# Patient Record
Sex: Female | Born: 1960
Health system: Southern US, Community
[De-identification: ages and names within clinical notes are randomized; demographics above are authoritative.]

## PROBLEM LIST (undated history)

## (undated) DIAGNOSIS — E785 Hyperlipidemia, unspecified: Secondary | ICD-10-CM

## (undated) DIAGNOSIS — F419 Anxiety disorder, unspecified: Secondary | ICD-10-CM

## (undated) DIAGNOSIS — M1990 Unspecified osteoarthritis, unspecified site: Secondary | ICD-10-CM

## (undated) DIAGNOSIS — T7840XA Allergy, unspecified, initial encounter: Secondary | ICD-10-CM

## (undated) DIAGNOSIS — K219 Gastro-esophageal reflux disease without esophagitis: Secondary | ICD-10-CM

## (undated) DIAGNOSIS — E559 Vitamin D deficiency, unspecified: Secondary | ICD-10-CM

## (undated) HISTORY — PX: TUBAL LIGATION: SHX77

## (undated) HISTORY — PX: COLONOSCOPY W/ POLYPECTOMY: SHX1380

## (undated) HISTORY — DX: Unspecified osteoarthritis, unspecified site: M19.90

## (undated) HISTORY — DX: Hyperlipidemia, unspecified: E78.5

## (undated) HISTORY — PX: HERNIA REPAIR: SHX51

## (undated) HISTORY — PX: COLONOSCOPY: SHX174

## (undated) HISTORY — DX: Vitamin D deficiency, unspecified: E55.9

## (undated) HISTORY — DX: Allergy, unspecified, initial encounter: T78.40XA

## (undated) HISTORY — DX: Gastro-esophageal reflux disease without esophagitis: K21.9

---

## 2018-02-01 ENCOUNTER — Ambulatory Visit (INDEPENDENT_AMBULATORY_CARE_PROVIDER_SITE_OTHER): Payer: 59 | Admitting: Family Medicine

## 2018-02-01 ENCOUNTER — Encounter (INDEPENDENT_AMBULATORY_CARE_PROVIDER_SITE_OTHER): Payer: Self-pay

## 2018-02-01 ENCOUNTER — Telehealth: Payer: Self-pay | Admitting: Family Medicine

## 2018-02-01 ENCOUNTER — Encounter: Payer: Self-pay | Admitting: Family Medicine

## 2018-02-01 VITALS — BP 102/70 | HR 88 | Temp 98.0°F | Resp 16 | Ht 62.0 in | Wt 129.0 lb

## 2018-02-01 DIAGNOSIS — E559 Vitamin D deficiency, unspecified: Secondary | ICD-10-CM | POA: Diagnosis not present

## 2018-02-01 DIAGNOSIS — Z1239 Encounter for other screening for malignant neoplasm of breast: Secondary | ICD-10-CM

## 2018-02-01 DIAGNOSIS — Z1231 Encounter for screening mammogram for malignant neoplasm of breast: Secondary | ICD-10-CM | POA: Diagnosis not present

## 2018-02-01 DIAGNOSIS — Z791 Long term (current) use of non-steroidal anti-inflammatories (NSAID): Secondary | ICD-10-CM

## 2018-02-01 DIAGNOSIS — M19049 Primary osteoarthritis, unspecified hand: Secondary | ICD-10-CM | POA: Diagnosis not present

## 2018-02-01 DIAGNOSIS — Z131 Encounter for screening for diabetes mellitus: Secondary | ICD-10-CM

## 2018-02-01 DIAGNOSIS — E785 Hyperlipidemia, unspecified: Secondary | ICD-10-CM | POA: Diagnosis not present

## 2018-02-01 DIAGNOSIS — Z7689 Persons encountering health services in other specified circumstances: Secondary | ICD-10-CM | POA: Diagnosis not present

## 2018-02-01 HISTORY — DX: Primary osteoarthritis, unspecified hand: M19.049

## 2018-02-01 NOTE — Telephone Encounter (Addendum)
Please request previous records:   Previous PCP:  Dr. Lorretta [place] [address]  Previous GYN: Dr. Pauline Good [address] [place] [address] [phone]  Mammograms:  [facility] [address] [place] [address]  [phone]

## 2018-02-01 NOTE — Progress Notes (Signed)
Name: Jennifer Lang   MRN: [id number]    DOB: 1961/11/25   Date:02/01/2018       Progress Note  Subjective  Chief Complaint  Chief Complaint  Patient presents with  . Establish Care  . Hyperlipidemia    refused medication from previos doctor    HPI  Pt presents to establish care - she moved from [place] - we will request previous records.  She is now working for [employer] in [employer].  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  Hyperlipidemia: She has been taking Red yeast rice extract on and off for about a year.  She eats mostly lean proteins, but does like some junk food and ice cream.  Mother and sister have hyperlipidemia.  We will check fasting labs today.  Health Maintenance: She reports colonoscopy 3 years ago, she thinks she is not due for 10 years, but we will request records and check on this.  Last mammogram - last year, she is due for this today.  Vitamin D Deficiency: Takes Vitamin D OTC supplement for history of deficiency - she notes that if she doesn't take it, she will get some tingling in the LEFT arm.  Allergic Rhinitis: Never had allergies until she moved to [place]. She takes Zyrtec as needed for rhinitis and headache.  Denies symptoms since being in [place].  Arthritis: Has never had formal work up; affects mostly her hands.  She takes Noni (natural supplement) from [place] that helps with her pain and inflammation. Does not get erythema or inflammation. Does not like to take ibuprofen; never takes Tylenol.  There are no active problems to display for this patient.  Past Surgical History:  Procedure Laterality Date  . CESAREAN SECTION    . TUBAL LIGATION      Family History  Problem Relation Age of Onset  . Arthritis Mother   . Cancer Mother   . Alzheimer's disease Mother   . Hyperlipidemia Mother   . Hypertension Father   . Breast cancer Paternal Aunt    Passed away in 59's from Breast CA  . Hyperlipidemia Sister   . Bladder Cancer Brother   . Prostate cancer Brother   . Kidney cancer Brother   . Stroke Maternal Grandmother   . Uterine cancer Paternal Grandmother     Social History   Socioeconomic History  . Marital status: Single    Spouse name: Not on file  . Number of children: Not on file  . Years of education: Not on file  . Highest education level: Not on file  Social Needs  . Financial resource strain: Not on file  . Food insecurity - worry: Not on file  . Food insecurity - inability: Not on file  . Transportation needs - medical: Not on file  . Transportation needs - non-medical: Not on file  Occupational History  . Not on file  Tobacco Use  . Smoking status: Never Smoker  . Smokeless tobacco: Never Used  Substance and Sexual Activity  . Alcohol use: No    Frequency: Never  . Drug use: No  . Sexual activity: Not on file  Other Topics Concern  . Not on file  Social History Narrative  . Not on file     Current Outpatient Medications:  .  cetirizine (ZYRTEC) 10 MG tablet, Take 10 mg by mouth daily., Disp: , Rfl:  .  cholecalciferol (  VITAMIN D) 400 units TABS tablet, Take 400 Units by mouth., Disp: , Rfl:  .  Multiple Vitamin (MULTIVITAMIN) tablet, Take 1 tablet by mouth daily., Disp: , Rfl:  .  Red Yeast Rice Extract (RED YEAST RICE PO), Take by mouth., Disp: , Rfl:   No Known Allergies  ROS Constitutional: Negative for fever or weight change.  Respiratory: Negative for cough and shortness of breath.   Cardiovascular: Negative for chest pain or palpitations.  Gastrointestinal: Negative for abdominal pain, no bowel changes.  Musculoskeletal: Negative for gait problem or joint swelling.  Skin: Negative for rash.  Neurological: Negative for dizziness or headache.  No other specific complaints in a complete review of systems (except as listed in HPI above).  Objective  Vitals:   02/01/18 0847  BP: 102/70   Pulse: 88  Resp: 16  Temp: 98 F (36.7 C)  TempSrc: Oral  SpO2: 96%  Weight: 129 lb (58.5 kg)  Height: 5\' 2"  (1.575 m)   Body mass index is 23.59 kg/m.  Physical Exam Constitutional: Patient appears well-developed and well-nourished. No distress.  HENT: Head: Normocephalic and atraumatic. Ears: B TMs ok, no erythema or effusion; Nose: Nose normal. Mouth/Throat: Oropharynx is clear and moist. No oropharyngeal exudate.  Eyes: Conjunctivae and EOM are normal. Pupils are equal, round, and reactive to light. No scleral icterus.  Neck: Normal range of motion. Neck supple. No JVD present.  Cardiovascular: Normal rate, regular rhythm and normal heart sounds.  No murmur heard. No BLE edema. Pulmonary/Chest: Effort normal and breath sounds normal. No respiratory distress. Musculoskeletal: Normal range of motion, no joint effusions. No gross deformities, joint erythema, or tenderness. Neurological: she is alert and oriented to person, place, and time. No cranial nerve deficit. Coordination, balance, strength, speech and gait are normal.  Skin: Skin is warm and dry. No rash noted. No erythema.  Psychiatric: Patient has a normal mood and affect. behavior is normal. Judgment and thought content normal.  No results found for this or any previous visit (from the past 72 hour(s)).  PHQ2/9: Depression screen PHQ 2/9 02/01/2018  Decreased Interest 0  Down, Depressed, Hopeless 0  PHQ - 2 Score 0    Fall Risk: Fall Risk  02/01/2018  Falls in the past year? No   Assessment & Plan  1. Hyperlipidemia, unspecified hyperlipidemia type - May continue red yeast rice for the time being.  - Discussed importance of 150 minutes of physical activity weekly, eat two servings of fish weekly, eat one serving of tree nuts ( cashews, pistachios, pecans, almonds.Marland Kitchen) every other day, eat 6 servings of fruit/vegetables daily and drink plenty of water and avoid sweet beverages.  - Lipid panel  2. Breast cancer  screening - MM DIGITAL SCREENING BILATERAL; Future  3. Vitamin D deficiency - VITAMIN D 25 Hydroxy (Vit-D Deficiency, Fractures)  4. Arthritis of hand - May continue OTC supplement and PRN ibuprofen. Avoid activities that trigger pain.  5. Diabetes mellitus screening - COMPLETE METABOLIC PANEL WITH GFR  6. NSAID long-term use - COMPLETE METABOLIC PANEL WITH GFR  7. Encounter to establish care - Records to be requested - see telephone note from 02/01/2018.   - Return for S/C CPE.

## 2018-02-10 ENCOUNTER — Encounter: Payer: Self-pay | Admitting: Family Medicine

## 2018-02-10 NOTE — Telephone Encounter (Signed)
Patient only sign 1 form for mammogram, which we received. Must sign on for other 2 offices at next appointment

## 2018-02-17 ENCOUNTER — Encounter: Payer: Self-pay | Admitting: Radiology

## 2018-02-17 ENCOUNTER — Ambulatory Visit
Admission: RE | Admit: 2018-02-17 | Discharge: 2018-02-17 | Disposition: A | Discharge status: Home / Self Care | Payer: 59 | Source: Ambulatory Visit | Attending: Family Medicine | Admitting: Family Medicine

## 2018-02-17 DIAGNOSIS — Z1231 Encounter for screening mammogram for malignant neoplasm of breast: Secondary | ICD-10-CM | POA: Diagnosis not present

## 2018-02-17 DIAGNOSIS — Z1239 Encounter for other screening for malignant neoplasm of breast: Secondary | ICD-10-CM

## 2018-03-01 ENCOUNTER — Encounter: Payer: Self-pay | Admitting: Family Medicine

## 2018-03-01 ENCOUNTER — Ambulatory Visit (INDEPENDENT_AMBULATORY_CARE_PROVIDER_SITE_OTHER): Payer: 59 | Admitting: Family Medicine

## 2018-03-01 VITALS — BP 110/70 | HR 83 | Temp 98.1°F | Resp 16 | Ht 62.0 in | Wt 129.3 lb

## 2018-03-01 DIAGNOSIS — E785 Hyperlipidemia, unspecified: Secondary | ICD-10-CM | POA: Diagnosis not present

## 2018-03-01 DIAGNOSIS — Z0000 Encounter for general adult medical examination without abnormal findings: Secondary | ICD-10-CM | POA: Diagnosis not present

## 2018-03-01 DIAGNOSIS — Z1159 Encounter for screening for other viral diseases: Secondary | ICD-10-CM | POA: Diagnosis not present

## 2018-03-01 DIAGNOSIS — Z114 Encounter for screening for human immunodeficiency virus [HIV]: Secondary | ICD-10-CM | POA: Diagnosis not present

## 2018-03-01 DIAGNOSIS — E559 Vitamin D deficiency, unspecified: Secondary | ICD-10-CM | POA: Diagnosis not present

## 2018-03-01 DIAGNOSIS — Z791 Long term (current) use of non-steroidal anti-inflammatories (NSAID): Secondary | ICD-10-CM | POA: Diagnosis not present

## 2018-03-01 NOTE — Progress Notes (Signed)
Name: Jennifer Lang   MRN: [id number]    DOB: [birth year]   Date:03/01/2018       Progress Note  Subjective  Chief Complaint  Chief Complaint  Patient presents with  . Annual Exam    HPI  Patient presents for annual CPE.  Diet: Cereal or toast; eats at work Clinical research associate) - chicken, veggies, and dessert; dinner (gets home at 8pm) will eat light like soup or veggies.  She does have pretzels, etc on days off. 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  USPSTF grade A and B recommendations  Depression: Feeling well, never had issues with depression in the past.  Depression screen [facility] 2/9 03/01/2018 02/01/2018  Decreased Interest 0 0  Down, Depressed, Hopeless 0 0  PHQ - 2 Score 0 0   Hypertension: BP Readings from Last 3 Encounters:  03/01/18 110/70  02/01/18 102/70   Obesity: Wt Readings from Last 3 Encounters:  03/01/18 129 lb 4.8 oz (58.7 kg)  02/01/18 129 lb (58.5 kg)   BMI Readings from Last 3 Encounters:  03/01/18 23.65 kg/m  02/01/18 23.59 kg/m    Alcohol: Never drinker Tobacco use: Never smoker HIV, hep B, hep C: Declines Hep B; will check HIV and Hep C today STD testing and prevention (chl/gon/syphilis): Declines Intimate partner violence: No concerns today Sexual History/Pain during Intercourse: Not currently sexually active - last intercourse was approx 2 years ago. Menstrual History/LMP/Abnormal Bleeding: Postmenopausal x5 years,  Incontinence Symptoms: Occasional urge incontinence   Breast cancer: Mammogram is UTD No results found for: HMMAMMO  BRCA gene screening: Mother and paternal aunt had breast cancer Cervical cancer screening: Pt states had Pap last year and was told not due for 2 years - we will request records today to review.   Osteoporosis: Had Dexa Scan once many years ago and she says it was WNL. We will request records today to review. No results found for: HMDEXASCAN   Fall prevention/vitamin D: Takes OTC supplement of vitamin D Lipids: Will check labs today No results found for: CHOL No results found for: HDL No results found for: LDLCALC No results found for: TRIG No results found for: CHOLHDL No results found for: LDLDIRECT  Glucose: We will check labs today No results found for: GLUCOSE, GLUCAP  Skin cancer: No concerning moles or lesions Colorectal cancer: We will request records today; she notes colonoscopy 3 years ago - she is unsure of recommended frequency. Aspirin: Currently takes some days. ECG: We will request records today; Used to have palpitations in the past - many years ago. Denies chest pain or shortness of breath.  Patient Active Problem List   Diagnosis Date Noted  . Arthritis of hand 02/01/2018  . Vitamin D deficiency 02/01/2018  . Hyperlipidemia 02/01/2018  . Breast cancer screening 02/01/2018    Past Surgical History:  Procedure Laterality Date  . CESAREAN SECTION    . TUBAL LIGATION      Family History  Problem Relation Age of Onset  . Arthritis Mother   . Cancer Mother   . Alzheimer's disease Mother   . Hyperlipidemia Mother   . Breast cancer Mother   . Hypertension Father   . Breast cancer Paternal Aunt        Passed away in 67's from Breast CA  . Hyperlipidemia Sister   . Bladder Cancer Brother   . Prostate cancer Brother   . Kidney cancer Brother   .  Stroke Maternal Grandmother   . Uterine cancer Paternal Grandmother     Social History   Socioeconomic History  . Marital status: Divorced    Spouse name: Not on file  . Number of children: 3  . Years of education: Not on file  . Highest education level: Associate degree: academic program  Social Needs  . Financial resource strain: Not on file  . Food insecurity - worry: Never true  . Food insecurity - inability: Never true  . Transportation needs - medical: No  . Transportation needs - non-medical: No  Occupational History  . Not on file   Tobacco Use  . Smoking status: Never Smoker  . Smokeless tobacco: Never Used  Substance and Sexual Activity  . Alcohol use: No    Frequency: Never  . Drug use: No  . Sexual activity: Not on file  Other Topics Concern  . Not on file  Social History Narrative   Lives with daughter and granddaughter.  Moved from [place] in August 2018. Works at [employer] at [employer] in [employer].     Current Outpatient Medications:  .  cetirizine (ZYRTEC) 10 MG tablet, Take 10 mg by mouth daily., Disp: , Rfl:  .  cholecalciferol (VITAMIN D) 400 units TABS tablet, Take 400 Units by mouth., Disp: , Rfl:  .  Multiple Vitamin (MULTIVITAMIN) tablet, Take 1 tablet by mouth daily., Disp: , Rfl:  .  Red Yeast Rice Extract (RED YEAST RICE PO), Take by mouth., Disp: , Rfl:   No Known Allergies   ROS  Constitutional: Negative for fever or weight change.  Respiratory: Negative for cough and shortness of breath.   Cardiovascular: Negative for chest pain or palpitations.  Gastrointestinal: Negative for abdominal pain, no bowel changes.  Musculoskeletal: Negative for gait problem or joint swelling.  Skin: Negative for rash.  Neurological: Negative for dizziness or headache.  No other specific complaints in a complete review of systems (except as listed in HPI above).  Objective  Vitals:   03/01/18 0820  BP: 110/70  Pulse: 83  Resp: 16  Temp: 98.1 F (36.7 C)  TempSrc: Oral  SpO2: 98%  Weight: 129 lb 4.8 oz (58.7 kg)  Height: 5' 2" (1.575 m)    Body mass index is 23.65 kg/m.  Physical Exam Constitutional: Patient appears well-developed and well-nourished. No distress.  HENT: Head: Normocephalic and atraumatic. Ears: B TMs ok, no erythema or effusion; Nose: Nose normal. Mouth/Throat: Oropharynx is clear and moist. No oropharyngeal exudate.  Eyes: Conjunctivae and EOM are normal. Pupils are equal, round, and reactive to light. No scleral icterus.  Neck: Normal range of motion. Neck supple.  No JVD present. No thyromegaly present.  Cardiovascular: Normal rate, regular rhythm and normal heart sounds.  No murmur heard. No BLE edema. Pulmonary/Chest: Effort normal and breath sounds normal. No respiratory distress. Abdominal: Soft. Bowel sounds are normal, no distension. There is no tenderness. no masses Musculoskeletal: Normal range of motion, no joint effusions. No gross deformities Neurological: he is alert and oriented to person, place, and time. No cranial nerve deficit. Coordination, balance, strength, speech and gait are normal.  Skin: Skin is warm and dry. No rash noted. No erythema.  Psychiatric: Patient has a normal mood and affect. behavior is normal. Judgment and thought content normal.   No results found for this or any previous visit (from the past 2160 hour(s)). ] PHQ2/9: Depression screen [facility] 2/9 03/01/2018 02/01/2018  Decreased Interest 0 0  Down, Depressed, Hopeless 0 0  PHQ - 2 Score 0 0    Fall Risk: Fall Risk  03/01/2018 02/01/2018  Falls in the past year? No No    Functional Status Survey: Is the patient deaf or have difficulty hearing?: No Does the patient have difficulty seeing, even when wearing glasses/contacts?: No Does the patient have difficulty concentrating, remembering, or making decisions?: No Does the patient have difficulty walking or climbing stairs?: No Does the patient have difficulty dressing or bathing?: No Does the patient have difficulty doing errands alone such as visiting a doctor's office or shopping?: No   Assessment & Plan  1. Well woman exam (no gynecological exam) - We will request records today (pt needed to sign release to allow for request).  If Pap is needed, she will return for this.  We will refer for colonoscopy if warranted.  She declines TDAP - states was done just prior to starting with [facility] this year - she will try to obtain records for [place]. -USPSTF grade A and B recommendations reviewed with patient;  age-appropriate recommendations, preventive care, screening tests, etc discussed and encouraged; healthy living encouraged; see AVS for patient education given to patient -Discussed importance of 150 minutes of physical activity weekly, eat two servings of fish weekly, eat one serving of tree nuts ( cashews, pistachios, pecans, almonds.Marland Kitchen) every other day, eat 6 servings of fruit/vegetables daily and drink plenty of water and avoid sweet beverages.  - Fasting labs as ordered at last visit.  2. Need for hepatitis C screening test - Hepatitis C antibody  3. Encounter for screening for HIV - HIV antibody

## 2018-03-01 NOTE — Patient Instructions (Addendum)
Kegel Exercises Kegel exercises help strengthen the muscles that support the rectum, vagina, small intestine, bladder, and uterus. Doing Kegel exercises can help:  Improve bladder and bowel control.  Improve sexual response.  Reduce problems and discomfort during pregnancy.  Kegel exercises involve squeezing your pelvic floor muscles, which are the same muscles you squeeze when you try to stop the flow of urine. The exercises can be done while sitting, standing, or lying down, but it is best to vary your position. Phase 1 exercises 1. Squeeze your pelvic floor muscles tight. You should feel a tight lift in your rectal area. If you are a female, you should also feel a tightness in your vaginal area. Keep your stomach, buttocks, and legs relaxed. 2. Hold the muscles tight for up to 10 seconds. 3. Relax your muscles. Repeat this exercise 50 times a day or as many times as told by your health care provider. Continue to do this exercise for at least 4-6 weeks or for as long as told by your health care provider. This information is not intended to replace advice given to you by your health care provider. Make sure you discuss any questions you have with your health care provider. Document Released: 11/16/2012 Document Revised: 07/25/2016 Document Reviewed: 10/20/2015 Elsevier Interactive Patient Education  2018 [place] Years, Female Preventive care refers to lifestyle choices and visits with your health care provider that can promote health and wellness. What does preventive care include?  A yearly physical exam. This is also called an annual well check.  Dental exams once or twice a year.  Routine eye exams. Ask your health care provider how often you should have your eyes checked.  Personal lifestyle choices, including: ? Daily care of your teeth and gums. ? Regular physical activity. ? Eating a healthy diet. ? Avoiding tobacco and drug use. ? Limiting  alcohol use. ? Practicing safe sex. ? Taking low-dose aspirin daily starting at age 56. ? Taking vitamin and mineral supplements as recommended by your health care provider. What happens during an annual well check? The services and screenings done by your health care provider during your annual well check will depend on your age, overall health, lifestyle risk factors, and family history of disease. Counseling Your health care provider may ask you questions about your:  Alcohol use.  Tobacco use.  Drug use.  Emotional well-being.  Home and relationship well-being.  Sexual activity.  Eating habits.  Work and work Statistician.  Method of birth control.  Menstrual cycle.  Pregnancy history.  Screening You may have the following tests or measurements:  Height, weight, and BMI.  Blood pressure.  Lipid and cholesterol levels. These may be checked every 5 years, or more frequently if you are over 62 years old.  Skin check.  Lung cancer screening. You may have this screening every year starting at age 37 if you have a 30-pack-year history of smoking and currently smoke or have quit within the past 15 years.  Fecal occult blood test (FOBT) of the stool. You may have this test every year starting at age 76.  Flexible sigmoidoscopy or colonoscopy. You may have a sigmoidoscopy every 5 years or a colonoscopy every 10 years starting at age 34.  Hepatitis C blood test.  Hepatitis B blood test.  Sexually transmitted disease (STD) testing.  Diabetes screening. This is done by checking your blood sugar (glucose) after you have not eaten for a while (fasting). You may have this  done every 1-3 years.  Mammogram. This may be done every 1-2 years. Talk to your health care provider about when you should start having regular mammograms. This may depend on whether you have a family history of breast cancer.  BRCA-related cancer screening. This may be done if you have a family  history of breast, ovarian, tubal, or peritoneal cancers.  Pelvic exam and Pap test. This may be done every 3 years starting at age 2. Starting at age 75, this may be done every 5 years if you have a Pap test in combination with an HPV test.  Bone density scan. This is done to screen for osteoporosis. You may have this scan if you are at high risk for osteoporosis.  Discuss your test results, treatment options, and if necessary, the need for more tests with your health care provider. Vaccines Your health care provider may recommend certain vaccines, such as:  Influenza vaccine. This is recommended every year.  Tetanus, diphtheria, and acellular pertussis (Tdap, Td) vaccine. You may need a Td booster every 10 years.  Varicella vaccine. You may need this if you have not been vaccinated.  Zoster vaccine. You may need this after age 30.  Measles, mumps, and rubella (MMR) vaccine. You may need at least one dose of MMR if you were born in 1957 or later. You may also need a second dose.  Pneumococcal 13-valent conjugate (PCV13) vaccine. You may need this if you have certain conditions and were not previously vaccinated.  Pneumococcal polysaccharide (PPSV23) vaccine. You may need one or two doses if you smoke cigarettes or if you have certain conditions.  Meningococcal vaccine. You may need this if you have certain conditions.  Hepatitis A vaccine. You may need this if you have certain conditions or if you travel or work in places where you may be exposed to hepatitis A.  Hepatitis B vaccine. You may need this if you have certain conditions or if you travel or work in places where you may be exposed to hepatitis B.  Haemophilus influenzae type b (Hib) vaccine. You may need this if you have certain conditions.  Talk to your health care provider about which screenings and vaccines you need and how often you need them. This information is not intended to replace advice given to you by your  health care provider. Make sure you discuss any questions you have with your health care provider. Document Released: 12/27/2015 Document Revised: 08/19/2016 Document Reviewed: 10/01/2015 Elsevier Interactive Patient Education  [employer].

## 2018-03-02 LAB — LIPID PANEL
CHOL/HDL RATIO: 4.2 (calc) (ref <5.0)
CHOLESTEROL: 215 mg/dL — AB (ref <200)
HDL: 51 mg/dL (ref >50)
LDL Cholesterol (Calc): 141 mg/dL (calc) — ABNORMAL HIGH
NON-HDL CHOLESTEROL (CALC): 164 mg/dL — AB (ref <130)
Triglycerides: 113 mg/dL (ref <150)

## 2018-03-02 LAB — COMPLETE METABOLIC PANEL WITH GFR
AG Ratio: 1.6 (calc) (ref 1.0–2.5)
ALT: 16 U/L (ref 6–29)
AST: 23 U/L (ref 10–35)
Albumin: 4.7 g/dL (ref 3.6–5.1)
Alkaline phosphatase (APISO): 105 U/L (ref 33–130)
BUN: 9 mg/dL (ref 7–25)
CALCIUM: 9.9 mg/dL (ref 8.6–10.4)
CO2: 30 mmol/L (ref 20–32)
CREATININE: 0.65 mg/dL (ref 0.50–1.05)
Chloride: 105 mmol/L (ref 98–110)
GFR, EST NON AFRICAN AMERICAN: 99 mL/min/{1.73_m2} (ref >=60)
GFR, Est African American: 114 mL/min/{1.73_m2} (ref >=60)
GLOBULIN: 3 g/dL (ref 1.9–3.7)
Glucose, Bld: 84 mg/dL (ref 65–99)
Potassium: 4 mmol/L (ref 3.5–5.3)
Sodium: 141 mmol/L (ref 135–146)
Total Bilirubin: 0.5 mg/dL (ref 0.2–1.2)
Total Protein: 7.7 g/dL (ref 6.1–8.1)

## 2018-03-02 LAB — HIV ANTIBODY (ROUTINE TESTING W REFLEX): HIV: NONREACTIVE

## 2018-03-02 LAB — VITAMIN D 25 HYDROXY (VIT D DEFICIENCY, FRACTURES): VIT D 25 HYDROXY: 42 ng/mL (ref 30–100)

## 2018-03-02 LAB — HEPATITIS C ANTIBODY
HEP C AB: NONREACTIVE
SIGNAL TO CUT-OFF: 0.03 (ref <1.00)

## 2018-03-08 ENCOUNTER — Encounter: Payer: Self-pay | Admitting: Family Medicine

## 2018-08-05 ENCOUNTER — Ambulatory Visit: Payer: Self-pay

## 2018-08-05 DIAGNOSIS — Z9189 Other specified personal risk factors, not elsewhere classified: Secondary | ICD-10-CM

## 2018-08-05 MED ORDER — DOXYCYCLINE HYCLATE 100 MG PO TABS: 200.0000 mg | ORAL_TABLET | Freq: Once | ORAL | 0 refills | Status: AC

## 2018-08-05 NOTE — Telephone Encounter (Signed)
We do not typically send antibiotics without a visit, however because rapid treatment is important for prevent, I have sent in 200mg  of doxycyline which she needs to take as a single dose.  If she does develop rash, joint aches, fevers, or other concerning symptoms, she needs to come in to be seen. Thanks!

## 2018-08-05 NOTE — Telephone Encounter (Signed)
Please advise 

## 2018-08-05 NOTE — Telephone Encounter (Signed)
Outgoing call to patient related to tick bite.  Patient states she had a tick bite on her back.  States it was one of the bigger ticks.   Doesn't feel as if it was attached long.  Doesn't feel that' it had started digging in yet' Patient current on Tetanus booster.  Patient had heard from a former nurse that there is some type of antibiotic that could be prescribed to be used within 48 hours to prevent lime disease. Would like Rx. to be called in. Denied request of me making her an appointment. Informed patient that I would relate message to office.                                             Reason for Disposition . Tick bite with no complications  Answer Assessment - Initial Assessment Questions 1. TYPE of TICK: "Is it a wood tick or a deer tick?" If unsure, ask: "What size was the tick?" "Did it look more like a watermelon seed or a poppy seed?"      Bigger tick 2. LOCATION: "Where is the tick bite located?"      back 3. ONSET: "How long do you think the tick was attached before you removed it?" (Hours or days)      Not very log started diggin in 4. TETANUS: "When was the last tetanus booster?"      Yes  5. PREGNANCY: "Is there any chance you are pregnant?" "When was your last menstrual period?"     na  Protocols used: TICK BITE-A-AH

## 2018-08-05 NOTE — Addendum Note (Signed)
Addended by: Hubbard Hartshorn on: 08/05/2018 03:38 PM   Modules accepted: Orders

## 2018-08-08 NOTE — Telephone Encounter (Signed)
Called left message for patient to make sure she got sdcript

## 2018-10-24 ENCOUNTER — Encounter: Payer: Self-pay | Admitting: Family Medicine

## 2018-10-24 ENCOUNTER — Ambulatory Visit (INDEPENDENT_AMBULATORY_CARE_PROVIDER_SITE_OTHER): Payer: 59 | Admitting: Family Medicine

## 2018-10-24 VITALS — BP 116/62 | HR 85 | Temp 98.2°F | Resp 12 | Ht 62.0 in | Wt 129.3 lb

## 2018-10-24 DIAGNOSIS — M7711 Lateral epicondylitis, right elbow: Secondary | ICD-10-CM

## 2018-10-24 HISTORY — DX: Lateral epicondylitis, right elbow: M77.11

## 2018-10-24 MED ORDER — MELOXICAM 15 MG PO TABS: 7.5000 mg | ORAL_TABLET | Freq: Every day | ORAL | 0 refills | Status: DC

## 2018-10-24 NOTE — Patient Instructions (Signed)
Tennis Elbow Tennis elbow is puffiness (inflammation) of the outer tendons of your forearm close to your elbow. Your tendons attach your muscles to your bones. Tennis elbow can happen in any sport or job in which you use your elbow too much. It is caused by doing the same motion over and over. Tennis elbow can cause:  Pain and tenderness in your forearm and the outer part of your elbow.  A burning feeling. This runs from your elbow through your arm.  Weak grip in your hands.  Follow these instructions at home: Activity  Rest your elbow and wrist as told by your doctor. Try to avoid any activities that caused the problem until your doctor says that you can do them again.  If a physical therapist teaches you exercises, do all of them as told.  If you lift an object, lift it with your palm facing up. This is easier on your elbow. Lifestyle  If your tennis elbow is caused by sports, check your equipment and make sure that: ? You are using it correctly. ? It fits you well.  If your tennis elbow is caused by work, take breaks often, if you are able. Talk with your manager about doing your work in a way that is safe for you. ? If your tennis elbow is caused by computer use, talk with your manager about any changes that can be made to your work setup. General instructions  If told, apply ice to the painful area: ? Put ice in a plastic bag. ? Place a towel between your skin and the bag. ? Leave the ice on for 20 minutes, 2-3 times per day.  Take medicines only as told by your doctor.  If you were given a brace, wear it as told by your doctor.  Keep all follow-up visits as told by your doctor. This is important. Contact a doctor if:  Your pain does not get better with treatment.  Your pain gets worse.  You have weakness in your forearm, hand, or fingers.  You cannot feel your forearm, hand, or fingers. This information is not intended to replace advice given to you by your health  care provider. Make sure you discuss any questions you have with your health care provider. Document Released: 05/20/2010 Document Revised: 07/30/2016 Document Reviewed: 11/26/2014 Elsevier Interactive Patient Education  2018 [place].  Elbow and Forearm Exercises Ask your health care provider which exercises are safe for you. Do exercises exactly as told by your health care provider and adjust them as directed. It is normal to feel mild stretching, pulling, tightness, or discomfort as you do these exercises, but you should stop right away if you feel sudden pain or your pain gets worse.Do not begin these exercises until told by your health care provider. RANGE OF MOTION EXERCISES These exercises warm up your muscles and joints and improve the movement and flexibility of your injured elbow and forearm. These exercises also help to relieve pain, numbness, and tingling.These exercises are done using the muscles in your injured elbow and forearm. Exercise A: Elbow Flexion, Active 1. Hold your left / right arm at your side, and bend your elbow as far as you can using your left / right arm muscles. 2. Hold this position for __________ seconds. 3. Slowly return to the starting position. Repeat __________ times. Complete this exercise __________ times a day. Exercise B: Elbow Extension, Active 1. Hold your left / right arm at your side, and straighten your elbow as  much as you can using your left / right arm muscles. 2. Hold this position for __________ seconds. 3. Slowly return to the starting position. Repeat __________ times. Complete this exercise __________ times a day. Exercise C: Forearm Rotation, Supination, Active 1. Stand or sit with your elbows at your sides. 2. Bend your left / right elbow to an "L" shape (90 degrees). 3. Turn your palm upward until you feel a gentle stretch on the inside of your forearm. 4. Hold this position for __________ seconds. 5. Slowly release and return to  the starting position. Repeat __________ times. Complete this exercise __________ times a day. Exercise D: Forearm Rotation, Pronation, Active 1. Stand or sit with your elbows at your side. 2. Bend your left / right elbow to an "L" shape (90 degrees). 3. Turn your left / right palm downward until you feel a gentle stretch on the top of your forearm. 4. Hold this position for __________ seconds. 5. Slowly release and return to the starting position. Repeat__________ times. Complete this exercise __________ times a day. STRETCHING EXERCISES These exercises warm up your muscles and joints and improve the movement and flexibility of your injured elbow and forearm. These exercises also help to relieve pain, numbness, and tingling.These exercises are done using your healthy elbow and forearm to help stretch the muscles in your injured elbow and forearm. Exercise E: Elbow Flexion, Active-Assisted  1. Hold your left / right arm at your side, and bend your elbow as much as you can using your left / right arm muscles. 2. Use your other hand to bend your left / right elbow farther. To do this, gently push up on your forearm until you feel a gentle stretch on the back of your elbow. 3. Hold this position for __________ seconds. 4. Slowly return to the starting position. Repeat __________ times. Complete this exercise __________ times a day. Exercise F: Elbow Extension, Active-Assisted  1. Hold your left / right arm at your side, and straighten your elbow as much as you can using your left / right arm muscles. 2. Use your other hand to straighten the left / right elbow farther. To do this, gently push down on your forearm until you feel a gentle stretch on the inside of your elbow. 3. Hold this position for __________ seconds. 4. Slowly return to the starting position. Repeat __________ times. Complete this exercise __________ times a day. Exercise G: Forearm Rotation, Supination,  Active-Assisted  1. Sit with your left / right elbow bent in an "L" shape (90 degrees) with your forearm resting on a table. 2. Keeping your upper body and shoulder still, rotate your forearm so your left / right palm faces upward. 3. Use your other hand to help rotate your forearm further until you feel a gentle to moderate stretch. 4. Hold this position for __________ seconds. 5. Slowly release the stretch and return to the starting position. Repeat __________ times. Complete this exercise __________ times a day. Exercise H: Forearm Rotation, Pronation, Active-Assisted  1. Sit with your left / right elbow bent in an "L" shape (90 degrees) with your forearm resting on a table. 2. Keeping your upper body and shoulder still, rotate your forearm so your palm faces the tabletop. 3. Use your other hand to help rotate your forearm further until you feel a gentle to moderate stretch. 4. Hold this position for __________ seconds. 5. Slowly release the stretch and return to the starting position. Repeat __________ times. Complete this exercise __________  times a day. Exercise I: Elbow Flexion, Supine, Passive 1. Lie on your back. 2. Extend your left / right arm up in the air, bracing it with your other hand. 3. Let your left / right your hand slowly lower toward your shoulder, while your elbow stays pointed toward the ceiling. You should feel a gentle stretch along the back of your upper arm and elbow. 4. If instructed by your health care provider, you may increase the intensity of your stretch by adding a small wrist weight or hand weight. 5. Hold this position for __________ seconds. 6. Slowly return to the starting position. Repeat __________ times. Complete this exercise __________ times a day. Exercise J: Elbow Extension, Supine, Passive  1. Lie on your back. Make sure that you are in a comfortable position that lets you relax your arm muscles. 2. Place a folded towel under your left / right  upper arm so your elbow and shoulder are at the same height. Straighten your left / right arm so your elbow does not rest on the bed or towel. 3. Let the weight of your hand stretch your elbow. Keep your arm and chest muscles relaxed. You should feel a stretch on the inside of your elbow. 4. If told by your health care provider, you may increase the intensity of your stretch by adding a small wrist weight or hand weight. 5. Hold this position for__________ seconds. 6. Slowly release the stretch. Repeat __________ times. Complete this exercise __________ times a day. STRENGTHENING EXERCISES These exercises build strength and endurance in your elbow and forearm. Endurance is the ability to use your muscles for a long time, even after they get tired. Exercise K: Elbow Flexion, Isometric  1. Stand or sit up straight. 2. Bend your left / right elbow in an "L" shape (90 degrees) and turn your palm up so your forearm is at the height of your waist. 3. Place your other hand on top of your forearm. Gently push down as your left / right arm resists. Push as hard as you can with both arms without causing any pain or movement at your left / right elbow. 4. Hold this position for __________ seconds. 5. Slowly release the tension in both arms. Let your muscles relax completely before repeating. Repeat __________ times. Complete this exercise __________ times a day. Exercise L: Elbow Extensors, Isometric  1. Stand or sit up straight. 2. Place your left / right arm so your palm faces your abdomen and it is at the height of your waist. 3. Place your other hand on the underside of your forearm. Gently push up as your left / right arm resists. Push as hard as you can with both arms, without causing any pain or movement at your left / right elbow. 4. Hold this position for __________ seconds. 5. Slowly release the tension in both arms. Let your muscles relax completely before repeating. Repeat __________ times.  Complete this exercise __________ times a day. Exercise M: Elbow Flexion With Forearm Palm Up  1. Sit upright on a firm chair without armrests, or stand. 2. Place your left / right arm at your side with your palm facing forward. 3. Holding a __________weight or gripping a rubber exercise band or tubing, bend your elbow to bring your hand toward your shoulder. 4. Hold this position for __________ seconds. 5. Slowly return to the starting position. Repeat __________times. Complete this exercise __________times a day. Exercise N: Elbow Extension  1. Sit on a firm chair  without armrests, or stand. 2. Keeping your upper arms at your sides, bring both hands up toward your left / right shoulder while you grip a rubber exercise band or tubing. Your left / right hand should be just below the other hand. 3. Straighten your left / right elbow. 4. Hold this position for __________ seconds. 5. Control the resistance of the band or tubing as your hand returns to your side. Repeat __________times. Complete this exercise __________times a day. Exercise O: Forearm Rotation, Supination  1. Sit with your left / right forearm supported on a table. Keep your elbow at waist height. 2. Rest your hand over the edge of the table with your palm facing down. 3. Gently hold a lightweight hammer. 4. Without moving your elbow, slowly rotate your forearm to turn your palm and hand upward to a "thumbs-up" position. 5. Hold this position for __________ seconds. 6. Slowly return to the starting position. Repeat __________times. Complete this exercise __________times a day. Exercise P: Forearm Rotation, Pronation  1. Sit with your left / right forearm supported on a table. Keep your elbow below shoulder height. 2. Rest your hand over the edge of the table with your palm facing up. 3. Gently hold a lightweight hammer. 4. Without moving your elbow, slowly rotate your forearm to turn your palm and hand upward to a  "thumbs-up" position. 5. Hold this position for __________seconds. 6. Slowly return to the starting position. Repeat __________times. Complete this exercise __________times a day. This information is not intended to replace advice given to you by your health care provider. Make sure you discuss any questions you have with your health care provider. Document Released: 10/14/2005 Document Revised: 04/09/2016 Document Reviewed: 08/25/2015 Elsevier Interactive Patient Education  [employer].

## 2018-10-24 NOTE — Progress Notes (Signed)
Name: Jennifer Lang   MRN: [id number]    DOB: December 29, 1960   Date:10/24/2018       Progress Note  Subjective  Chief Complaint  Chief Complaint  Patient presents with  . Elbow Pain    right onset months    HPI  Pt presents with concern for right lateral elbow pain for several months.  She has had the same issue with the LEFT in the past, but was able to rest this arm and rehab it herself.  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  She is taking 800mg  Ibuprofen PRN with only some relief.  Denies weakness, numbness, or tingling.  She has been wearing a tendonitis band on her forearm and this seems to help.  Pain is worse in the morning.  Patient Active Problem List   Diagnosis Date Noted  . Arthritis of hand 02/01/2018  . Vitamin D deficiency 02/01/2018  . Hyperlipidemia 02/01/2018  . Breast cancer screening 02/01/2018    Social History   Tobacco Use  . Smoking status: Never Smoker  . Smokeless tobacco: Never Used  Substance Use Topics  . Alcohol use: No    Frequency: Never     Current Outpatient Medications:  .  cetirizine (ZYRTEC) 10 MG tablet, Take 10 mg by mouth daily., Disp: , Rfl:  .  cholecalciferol (VITAMIN D) 400 units TABS tablet, Take 400 Units by mouth., Disp: , Rfl:  .  Multiple Vitamin (MULTIVITAMIN) tablet, Take 1 tablet by mouth daily., Disp: , Rfl:  .  Red Yeast Rice Extract (RED YEAST RICE PO), Take by mouth., Disp: , Rfl:   No Known Allergies  I personally reviewed active problem list, medication list, allergies, lab results with the patient/caregiver today.  ROS  Ten systems reviewed and is negative except as mentioned in HPI  Objective  Vitals:   10/24/18 0835  BP: 116/62  Pulse: 85  Resp: 12  Temp: 98.2 F (36.8 C)  TempSrc: Oral  SpO2: 99%  Weight: 129 lb 4.8 oz (58.7 kg)    Height: 5\' 2"  (1.575 m)   Body mass index is 23.65 kg/m.  Nursing Note and Vital Signs reviewed.  Physical Exam  Constitutional: Patient appears well-developed and well-nourished. No distress.  HENT: Head: Normocephalic and atraumatic. Eyes: Conjunctivae and EOM are normal. No scleral icterus.   Neck: Normal range of motion. Neck supple. No JVD present. Cardiovascular: Normal rate, regular rhythm and normal heart sounds.  No murmur heard. No BLE edema. Pulmonary/Chest: Effort normal and breath sounds normal. No respiratory distress. Musculoskeletal: Normal range of motion, no joint effusions. No gross deformities; +tenderness over the right lateral epicondyl; no bursitis, no erythema, no crepitus.   Neurological: Pt is alert and oriented to person, place, and time. No cranial nerve deficit. Coordination, balance, strength +5 bilaterally, speech and gait are normal.  Skin: Skin is warm and dry. No rash noted. No erythema.  Psychiatric: Patient has a normal mood and affect. behavior is normal. Judgment and thought content normal.  No results found for this or any previous visit (from the past 72 hour(s)).  Assessment & Plan  1. Lateral epicondylitis of right elbow - Ambulatory referral to Physical Therapy - meloxicam (MOBIC) 15 MG tablet; Take 0.5-1 tablets (7.5-15 mg total) by mouth daily.  Dispense: 30 tablet; Refill: 0  -Red flags and when to  present for emergency care or RTC including fever >101.32F, chest pain, shortness of breath, new/worsening/un-resolving symptoms, extremity weakness/numbness/tingling reviewed with patient at time of visit. Follow up and care instructions discussed and provided in AVS.

## 2018-11-23 ENCOUNTER — Encounter: Payer: Self-pay | Admitting: Physical Therapy

## 2018-11-23 ENCOUNTER — Ambulatory Visit: Payer: 59 | Attending: Family Medicine | Admitting: Physical Therapy

## 2018-11-23 DIAGNOSIS — M7711 Lateral epicondylitis, right elbow: Secondary | ICD-10-CM | POA: Diagnosis not present

## 2018-11-23 NOTE — Therapy (Signed)
[facility] [address], [place], [address] Phone: [phone]   Fax:  [phone]  Physical Therapy Treatment  Patient Details  Name: Jennifer Lang MRN: [id number] Date of Birth: [birth year] Referring Provider (PT): Raelyn Ensign   Encounter Date: 11/23/2018  PT End of Session - 11/23/18 1203    Visit Number  1    Number of Visits  17    Date for PT Re-Evaluation  01/18/19    PT Start Time  0930    PT Stop Time  1020    PT Time Calculation (min)  50 min    Activity Tolerance  Patient tolerated treatment well    Behavior During Therapy  [facility] for tasks assessed/performed       Past Medical History:  Diagnosis Date  . Arthritis   . Hyperlipidemia   . Vitamin D deficiency     Past Surgical History:  Procedure Laterality Date  . CESAREAN SECTION    . TUBAL LIGATION      There were no vitals filed for this visit.  Subjective Assessment - 11/23/18 0935    Subjective  R lateral elbow epicondylitis    Pertinent History  Patient is a 57 year old female presenting with R lateral elbow pain over the past 3 months. 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 Patient reports she has been wearing a tendon brace which has somewhat helped her pain. Reports worst pain in the past week: 5/10; best: 1/10.  Pt denies N/V, unexplained weight fluctuation, saddle paresthesia, fever, night sweats, B&B changes, or unrelenting night pain at this time.    Limitations  Lifting;Writing    How long can you sit comfortably?  Unlimited    How long can you stand comfortably?  Unlimited    How long can you walk comfortably?  Unlimited    Diagnostic tests  None to date    Patient Stated Goals  Work without pain    Currently in Pain?  Yes    Pain Score  1     Pain Location  Elbow    Pain Orientation  Right;Lateral    Pain  Descriptors / Indicators  Sharp    Pain Type  Acute pain    Pain Radiating Towards  into forearm    Pain Onset  1 to 4 weeks ago    Pain Frequency  Constant    Aggravating Factors   pulling, pouring, grasping    Pain Relieving Factors  Icing, pain medication    Effect of Pain on Daily Activities  Unable to complete work duties without pain             OBJECTIVE  MUSCULOSKELETAL: Tremor: Normal Bulk: Normal Tone: Normal  Palpation Pain to palpation at lateral aspect of elbow over extensor bundle insertion, with some tension, but no increased pain at muscle belly.   Posture: forward head, rounded shoulders, decreased periscapular activation, thoracic kyphosis  Strength R/L 5/5 Shoulder flexion (anterior deltoid/pec major/coracobrachialis, axillary n. (C5-6) and musculocutaneous n. (C5-7)) 5/5 Shoulder abduction (deltoid/supraspinatus, axillary/suprascapular n, C5) 5/5 Shoulder external rotation (infraspinatus/teres minor) 5/5 Shoulder internal rotation (subcapularis/lats/pec major) 5/5 Shoulder extension (posterior deltoid, lats, teres major, axillary/thoracodorsal n.) 5/5 Elbow flexion (biceps brachii, brachialis, brachioradialis, musculoskeletal n, C5-6) 5/5 Elbow extension (triceps, radial n, C7) 5/5 Wrist Extension 5/5 Wrist Flexion 5/5 Wrist Radial Deviation 5/5 Wrist Ulnar  Deviation 5/5 Forearm Supination 5/5 Pronation Grip strength 45lbs bilat  AROM All cervical, shoulder, elbow, wrist motions wnl with "stretching" with lateral cervical motion b/l, and bilat shoulder IR apleys  *Indicates pain, overpressure performed unless otherwise indicated  PROM PROM = AROM  Accessory Motions/Glides Glenohumeral: Posterior: R: normal L: normal Anterior: R: normal L: normal   NEUROLOGICAL:  Sensation Grossly intact to light touch bilateral UE as determined by testing dermatomes C2-T2 Proprioception and hot/cold testing deferred on this date   SPECIAL  TESTS  Rotator Cuff  Drop Arm Test: NEGATIVE bilat Painful Arc (Pain from 60 to 120 degrees scaption): NEGATIVE bilat Infraspinatus Muscle Test: NEGATIVE bilat If all 3 tests positive, the probability of a full-thickness rotator cuff tear is 91%  Subacromial Impingement Hawkins-Kennedy: NEGATIVE Neer (Block scapula, PROM flexion): NEGATIVE Empty Can: NEGATIVE bilat External Rotation Resistance: NEGATIVE bilat   Elbow:  Cozen's  (+) on RUE Mill's (-) bilat Maudsley middle finger (+) on RUE Golfer Elbow passively sup and ext wrist  (-) bilat Valgus test  (-) bilat Varus test  (-) bilat   Ther-Ex - Education on condition, anatomy involved, and prognosis - Stretch into elbow ext/flex flex 30sec hold with cuing for gentle stretch - Advisement for ice 2x day 15-60mins with education on ice massage using frozen dixie cup - Activity modification education    [facility] PT Assessment - 11/23/18 0001      Assessment   Medical Diagnosis  Lateral epicondylitis    Referring Provider (PT)  Raelyn Ensign    Onset Date/Surgical Date  08/24/18    Hand Dominance  Right    Prior Therapy  None      Balance Screen   Has the patient fallen in the past 6 months  No    Has the patient had a decrease in activity level because of a fear of falling?   No    Is the patient reluctant to leave their home because of a fear of falling?   No      Home Environment   Living Environment  Private residence    Living Arrangements  Alone    Available Help at Discharge  Family    Type of [place] to enter    Entrance Stairs-Rails  Left    Home Layout  Two level    [place]  None      Sensation   Light Touch  Appears Intact                               PT Education - 11/23/18 1203    Education Details  Patient was educated on diagnosis, anatomy and pathology involved, prognosis, role of PT, and was given an HEP, demonstrating exercise with proper  form following verbal and tactile cues, and was given a paper hand out to continue exercise at home. Pt was educated on and agreed to plan of care.    Person(s) Educated  Patient    Methods  Explanation;Demonstration;Verbal cues;Handout    Comprehension  Verbalized understanding;Returned demonstration;Verbal cues required       PT Short Term Goals - 11/23/18 1208      PT SHORT TERM GOAL #1   Title   Pt will be independent with HEP in order to improve strength and flexibility in order to decrease pain and improve function at home and work.    Time  4    Period  Weeks    Status  New        PT Long Term Goals - 11/23/18 1208      PT LONG TERM GOAL #1   Title  Pt will decrease worst pain as reported on NPRS by at least 3 points in order to demonstrate clinically significant reduction in pain.    Baseline  11/23/18 6/10 pain with work tasks    Time  [place] - 11/23/18 1210    Clinical Impression Statement  Patient is a 57 year old female with lateral elbow pain with signs and symptoms consistent with lateral epicondylitis. Patient with impairments in periscapular strength, postural deficits, and pain. 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 Would benefit from skilled PT to address above deficits and promote optimal return to PLOF    Clinical Presentation  Evolving    Clinical Presentation due to:  2 personal factors/comorbidities, 3 body systems/activity limitations/participation restrictions     Clinical Decision Making  Moderate    Rehab Potential  Good    Clinical Impairments Affecting Rehab Potential  (+) motivation, fairly young age (-) sedentary lifestyle, arthritis, repeatitive motion for work    PT Frequency  2x / week    PT Duration  8 weeks    PT Treatment/Interventions  ADLs/Self Care Home Management;Electrical  Stimulation;Therapeutic activities;Therapeutic exercise;Iontophoresis 4mg /ml Dexamethasone;Moist Heat;Cryotherapy;Ultrasound;Neuromuscular re-education;Manual techniques;Patient/family education;Dry needling;Spinal Manipulations;Joint Manipulations    PT Next Visit Plan  FOTO, possible TDN to wrist ext/STM    PT Home Exercise Plan  wrist ext stretch, ice 87mins 2x/day    Consulted and Agree with Plan of Care  Patient       Patient will benefit from skilled therapeutic intervention in order to improve the following deficits and impairments:  Decreased mobility  Visit Diagnosis: Lateral epicondylitis of right elbow     Problem List Patient Active Problem List   Diagnosis Date Noted  . Lateral epicondylitis of right elbow 10/24/2018  . Arthritis of hand 02/01/2018  . Vitamin D deficiency 02/01/2018  . Hyperlipidemia 02/01/2018  . Breast cancer screening 02/01/2018   [name] PT, DPT [name] 11/23/2018, 12:58 PM  [facility] [address] [place], [address] Phone: [phone]   Fax:  [phone]  Name: Jennifer Lang MRN: [id number] Date of Birth: Dec 09, 1961

## 2018-11-25 ENCOUNTER — Encounter: Payer: Self-pay | Admitting: Physical Therapy

## 2018-11-25 ENCOUNTER — Ambulatory Visit: Payer: 59 | Admitting: Physical Therapy

## 2018-11-25 DIAGNOSIS — M7711 Lateral epicondylitis, right elbow: Secondary | ICD-10-CM

## 2018-11-25 NOTE — Therapy (Addendum)
[facility] [address] [place], [address] Phone: [phone]   Fax:  [phone]  Physical Therapy Treatment  Patient Details  Name: Jennifer Lang MRN: [id number] Date of Birth: February 19, 1961 Referring Provider (PT): Raelyn Ensign   Encounter Date: 11/25/2018  PT End of Session - 11/25/18 1024    Visit Number  2    Number of Visits  17    Date for PT Re-Evaluation  01/18/19    PT Start Time  0955    PT Stop Time  1035    PT Time Calculation (min)  40 min    Activity Tolerance  Patient tolerated treatment well    Behavior During Therapy  [facility] for tasks assessed/performed       Past Medical History:  Diagnosis Date  . Arthritis   . Hyperlipidemia   . Vitamin D deficiency     Past Surgical History:  Procedure Laterality Date  . CESAREAN SECTION    . TUBAL LIGATION      There were no vitals filed for this visit.  Subjective Assessment - 11/25/18 1001    Subjective  Patient reports she has noticed that the majority of her pain is with reaching and pulling up with wrist ext (reaching into the washer, or grabbing things from the floor). Patient reports 2/10 pain this morning with 8/10 pain with wrist ext. Patient reports compliance with HEP with no questions or concerns.     Pertinent History  Patient is a 57 year old female presenting with R lateral elbow pain over the past 3 months. 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 Patient reports she has been wearing a tendon brace which has somewhat helped her pain. Reports worst pain in the past week: 5/10; best: 1/10.  Pt denies N/V, unexplained weight fluctuation, saddle paresthesia, fever, night sweats, B&B changes, or unrelenting night pain at this time.    Limitations  Lifting;Writing    How long can you sit comfortably?  Unlimited    How long can  you stand comfortably?  Unlimited    How long can you walk comfortably?  Unlimited    Diagnostic tests  None to date    Patient Stated Goals  Work without pain    Pain Onset  1 to 4 weeks ago       Ther-Ex - Standing rows GTB 3x 10 with demo and cuing initially for proper form with full scapular retraction with good carry over following - Standing low rows GTB 3x 10 with cuing initially to prevent shoulder hiking wit good carry over following - Bicep 4# DB 3x 10 with cuing for eccentric control  - Education on activity modification to include bicep curl and pulling with neutral wrist for lifting   Manual - STM to wrist extensors. Following, dry Needling: (3) 8mm .30 needles placed along the L extensor wad to decrease increased muscular spasms and trigger points with the patient positioned in sitting with forarm resting on armrest. Patient was educated on risks and benefits of therapy and verbally consents to PT.      ESTIM  HiVolt ESTIM 10 min at patient tolerated 100V increased to 140V through treatment at R wrist extensor area . Attempted to decrease muscle tension in this area. With PT assessing patient tolerance throughout (increasing intensity as needed), monitoring skin integrity (normal), with decreased pain noted  from patient. During this time PT completed ice massage to lateral tendon insertion 58min and educated patient on continuing activity modifications for 2 weeks to allow time to decrease irritation/inflammation.                        PT Education - 11/25/18 1019    Education Details  ESTIM and TDN education, activity modification    Person(s) Educated  Patient    Methods  Explanation;Demonstration;Verbal cues;Tactile cues    Comprehension  Verbalized understanding;Returned demonstration;Verbal cues required;Tactile cues required       PT Short Term Goals - 11/23/18 1208      PT SHORT TERM GOAL #1   Title   Pt will be independent with HEP in order  to improve strength and flexibility in order to decrease pain and improve function at home and work.    Time  4    Period  Weeks    Status  New        PT Long Term Goals - 11/23/18 1208      PT LONG TERM GOAL #1   Title  Pt will decrease worst pain as reported on NPRS by at least 3 points in order to demonstrate clinically significant reduction in pain.    Baseline  11/23/18 6/10 pain with work tasks    Time  [place] - 11/25/18 1042    Clinical Impression Statement  Patient was educated on manual + modality techniques for muscle tension relief, which patient verbalized understanding of and tolerated well. Following, patient reports her forearm feels "looser". PT led patient through therex with education for carry over for to activity modification for pulling/lifting, which patient is able to complete with accuracy following PT cuing. PT encouraged continued HEP compliance with activity modification to maintain progress made this session- patient verbalized understanding.     Rehab Potential  Good    Clinical Impairments Affecting Rehab Potential  (+) motivation, fairly young age (-) sedentary lifestyle, arthritis, repeatitive motion for work    PT Frequency  2x / week    PT Duration  8 weeks    PT Treatment/Interventions  ADLs/Self Care Home Management;Electrical Stimulation;Therapeutic activities;Therapeutic exercise;Iontophoresis 4mg /ml Dexamethasone;Moist Heat;Cryotherapy;Ultrasound;Neuromuscular re-education;Manual techniques;Patient/family education;Dry needling;Spinal Manipulations;Joint Manipulations    PT Next Visit Plan  FOTO, possible TDN to wrist ext/STM    PT Home Exercise Plan  wrist ext stretch, ice 68mins 2x/day    Consulted and Agree with Plan of Care  Patient       Patient will benefit from skilled therapeutic intervention in order to improve the following deficits and impairments:  Decreased mobility, Decreased range of  motion, Impaired tone, Decreased activity tolerance, Decreased strength, Increased fascial restricitons, Impaired flexibility, Pain  Visit Diagnosis: Lateral epicondylitis of right elbow     Problem List Patient Active Problem List   Diagnosis Date Noted  . Lateral epicondylitis of right elbow 10/24/2018  . Arthritis of hand 02/01/2018  . Vitamin D deficiency 02/01/2018  . Hyperlipidemia 02/01/2018  . Breast cancer screening 02/01/2018   [name] PT, DPT [name] 11/25/2018, 10:46 AM  [facility] [address] [place], [address] Phone: [phone]   Fax:  [phone]  Name: Jennifer Lang MRN: [id number] Date of Birth: 21-Dec-1960

## 2018-11-29 ENCOUNTER — Ambulatory Visit: Payer: 59 | Admitting: Physical Therapy

## 2018-11-29 ENCOUNTER — Encounter: Payer: Self-pay | Admitting: Physical Therapy

## 2018-11-29 DIAGNOSIS — M7711 Lateral epicondylitis, right elbow: Secondary | ICD-10-CM

## 2018-11-29 NOTE — Therapy (Signed)
[facility] [address], [place], [address] Phone: [phone]   Fax:  [phone]  Physical Therapy Treatment  Patient Details  Name: Jennifer Lang MRN: [id number] Date of Birth: 10/06/1961 Referring Provider (PT): Raelyn Ensign   Encounter Date: 11/29/2018  PT End of Session - 11/29/18 1002    Visit Number  3    Number of Visits  17    Date for PT Re-Evaluation  01/18/19    PT Start Time  0945    PT Stop Time  1030    PT Time Calculation (min)  45 min    Activity Tolerance  Patient tolerated treatment well    Behavior During Therapy  [facility] for tasks assessed/performed       Past Medical History:  Diagnosis Date  . Arthritis   . Hyperlipidemia   . Vitamin D deficiency     Past Surgical History:  Procedure Laterality Date  . CESAREAN SECTION    . TUBAL LIGATION      There were no vitals filed for this visit.  Subjective Assessment - 11/29/18 0948    Subjective  Patient reports she felt better following last session. Reports 1/10 pain this am, with 7/10 with work duties. Patient reports compliance with HEP with no questions or concerns.     Pertinent History  Patient is a 57 year old female presenting with R lateral elbow pain over the past 3 months. 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 Patient reports she has been wearing a tendon brace which has somewhat helped her pain. Reports worst pain in the past week: 5/10; best: 1/10.  Pt denies N/V, unexplained weight fluctuation, saddle paresthesia, fever, night sweats, B&B changes, or unrelenting night pain at this time.    Limitations  Lifting;Writing    How long can you sit comfortably?  Unlimited    How long can you stand comfortably?  Unlimited    How long can you walk comfortably?  Unlimited    Diagnostic tests  None to date    Patient  Stated Goals  Work without pain          Ther-Ex - Standing rows 10# x10; 15# 2x 10 with min cuing for scap retraction without shoulder hiking with good carry over - Standing scaption 2# 3x 10 with demo and cuing intially for eccentric control and scapular position with good carry over following - Bicep 5# DB 3x 10 with cuing for eccentric control  - Isometric wrist ext with manual PT assistance 10x 5sec hold; demo of completing with opposite hand or wall at home    Manual - STM to wrist extensors. Following, dry Needling: (3) 24mm .30 needles placed along the L extensor wad to decrease increased muscular spasms and trigger points with the patient positioned in sitting with forarm resting on armrest. Patient was educated on risks and benefits of therapy and verbally consents to PT.     ESTIMHiVolt ESTIM10 min at patient tolerated110Vincreased to140V through treatmentat R wrist extarea. Attempted to decrease muscle tension in this area. With PT assessing patient tolerance throughout (increasing intensity as needed), monitoring skin integrity (normal), with decreased pain noted from patient. During this time PT completed ice massage to lateral tendon insertion 55min and educated patient on how to complete cup ice massage at home, as patient reports the elbow is "awkward to ice"  PT Education - 11/29/18 1002    Education Details  Exercise form    Person(s) Educated  Patient    Methods  Explanation;Demonstration;Verbal cues    Comprehension  Verbalized understanding;Returned demonstration;Verbal cues required       PT Short Term Goals - 11/23/18 1208      PT SHORT TERM GOAL #1   Title   Pt will be independent with HEP in order to improve strength and flexibility in order to decrease pain and improve function at home and work.    Time  4    Period  Weeks    Status  New        PT Long Term Goals - 11/23/18 1208      PT LONG TERM  GOAL #1   Title  Pt will decrease worst pain as reported on NPRS by at least 3 points in order to demonstrate clinically significant reduction in pain.    Baseline  11/23/18 6/10 pain with work tasks    Time  [place] - 11/29/18 1018    Clinical Impression Statement  PT continued manual + modality techqniues to relieve soft tissue tension with noted decreased tension and pain relief noted from patient. PT continued therex for periscapular muscle activation, which patient is able to achieve with cuing and demonstration from PT. PT educated patient on isometric strengthening for pain-free maintainence of strength in wrist extensors, which patient is able to demonstrate understanding of. PT will continue progression as able.     Rehab Potential  Good    Clinical Impairments Affecting Rehab Potential  (+) motivation, fairly young age (-) sedentary lifestyle, arthritis, repeatitive motion for work    PT Frequency  2x / week    PT Duration  8 weeks    PT Treatment/Interventions  ADLs/Self Care Home Management;Electrical Stimulation;Therapeutic activities;Therapeutic exercise;Iontophoresis 4mg /ml Dexamethasone;Moist Heat;Cryotherapy;Ultrasound;Neuromuscular re-education;Manual techniques;Patient/family education;Dry needling;Spinal Manipulations;Joint Manipulations    PT Next Visit Plan  decreased wrist ext tension, periscapular strengthening    PT Home Exercise Plan  wrist ext stretch, ice 54mins 2x/day    Consulted and Agree with Plan of Care  Patient       Patient will benefit from skilled therapeutic intervention in order to improve the following deficits and impairments:  Decreased mobility, Decreased range of motion, Impaired tone, Decreased activity tolerance, Decreased strength, Increased fascial restricitons, Impaired flexibility, Pain  Visit Diagnosis: Lateral epicondylitis of right elbow     Problem List Patient Active Problem List    Diagnosis Date Noted  . Lateral epicondylitis of right elbow 10/24/2018  . Arthritis of hand 02/01/2018  . Vitamin D deficiency 02/01/2018  . Hyperlipidemia 02/01/2018  . Breast cancer screening 02/01/2018   [name] PT, DPT [name] 11/29/2018, 10:53 AM  [facility] [address], [place], [address] Phone: [phone]   Fax:  [phone]  Name: Latara Micheli MRN: [id number] Date of Birth: 1960-12-20

## 2018-12-01 ENCOUNTER — Encounter: Payer: Self-pay | Admitting: Physical Therapy

## 2018-12-01 ENCOUNTER — Ambulatory Visit: Payer: 59 | Admitting: Physical Therapy

## 2018-12-01 DIAGNOSIS — M7711 Lateral epicondylitis, right elbow: Secondary | ICD-10-CM | POA: Diagnosis not present

## 2018-12-01 NOTE — Therapy (Signed)
[facility] [address] [place], [address] Phone: [phone]   Fax:  [phone]  Physical Therapy Treatment  Patient Details  Name: Jennifer Lang MRN: [id number] Date of Birth: December 26, 1960 Referring Provider (PT): Raelyn Ensign   Encounter Date: 12/01/2018  PT End of Session - 12/01/18 1000    Visit Number  4    Number of Visits  17    Date for PT Re-Evaluation  01/18/19    PT Start Time  0945    PT Stop Time  1030    PT Time Calculation (min)  45 min    Activity Tolerance  Patient tolerated treatment well    Behavior During Therapy  [facility] for tasks assessed/performed       Past Medical History:  Diagnosis Date  . Arthritis   . Hyperlipidemia   . Vitamin D deficiency     Past Surgical History:  Procedure Laterality Date  . CESAREAN SECTION    . TUBAL LIGATION      There were no vitals filed for this visit.  Subjective Assessment - 12/01/18 0945    Subjective  Patient reports she feels like she is steadily getting better and feels better after TDN. Patient reports minimal pain today with 4/10 pain at work. Patient reports compliance with her HEP with no questions or concerns.     Pertinent History  Patient is a 57 year old female presenting with R lateral elbow pain over the past 3 months. 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 Patient reports she has been wearing a tendon brace which has somewhat helped her pain. Reports worst pain in the past week: 5/10; best: 1/10.  Pt denies N/V, unexplained weight fluctuation, saddle paresthesia, fever, night sweats, B&B changes, or unrelenting night pain at this time.    Limitations  Lifting;Writing    How long can you sit comfortably?  Unlimited    How long can you stand comfortably?  Unlimited    How long can you walk comfortably?  Unlimited    Diagnostic tests  None to date    Patient Stated Goals  Work without pain    Pain Onset  1 to 4 weeks ago           Ther-Ex - YTI 3x 8 in each position with TC for proper scapular contraction with good carry over following - Standing rows 15# 3x 10 with good carry over from previous session - Hammer curl 4# DB x10; 5# x10; 6x 10 with min cuing initially for proper ROM with good carry over following Lat pulldown 15# x10; 20# 2x 10 with demo and min cuing for proper form with good carry over following - Review of isometric wrist ext which patient demonstrates with accuracy and reports she is completing at home with no compliant's of pain   Manual - STM to wrist extensors. Following, dry Needling: (3)50mm .30needles placed along the L extensor wadto decrease increased muscular spasms and trigger points with the patient positioned in sitting with forarm resting on armrest. Patient was educated on risks and benefits of therapy and verbally consents to PT.   ESTIMHiVolt ESTIM72min at patient tolerated165Vincreased to170V through treatmentat R wrist extarea. Attemptedto decrease muscle tension in this area. With PT assessing patient tolerance throughout (increasing intensity as needed), monitoring skin integrity (normal), with decreased pain noted from patient. During this  time PT completed ice massage to lateral tendon insertion 65min and monitored intensity                     PT Education - 12/01/18 1000    Education Details  Exercise form    Person(s) Educated  Patient    Methods  Explanation;Demonstration;Verbal cues    Comprehension  Verbalized understanding;Returned demonstration;Verbal cues required       PT Short Term Goals - 11/23/18 1208      PT SHORT TERM GOAL #1   Title   Pt will be independent with HEP in order to improve strength and flexibility in order to decrease pain and improve function at home and work.    Time  4    Period   Weeks    Status  New        PT Long Term Goals - 11/23/18 1208      PT LONG TERM GOAL #1   Title  Pt will decrease worst pain as reported on NPRS by at least 3 points in order to demonstrate clinically significant reduction in pain.    Baseline  11/23/18 6/10 pain with work tasks    Time  8    Period  Weeks    Status  New            Plan - 12/01/18 1019    Clinical Impression Statement  PT continued soft tissue techniques to decrease tension and pain in wrist ext which patient responded well to with noted decreased tension. PT continued therex progression for prime movers, which patient tolerated well with no inc pain, only noted muscle fatigue. Patient able to complete all therex with proper form following PT demo and cuing. PT will continue therex progression with addition of wrist ext strengthening shoulder decreased pain continue.     Rehab Potential  Good    Clinical Impairments Affecting Rehab Potential  (+) motivation, fairly young age (-) sedentary lifestyle, arthritis, repeatitive motion for work    PT Frequency  2x / week    PT Duration  8 weeks    PT Treatment/Interventions  ADLs/Self Care Home Management;Electrical Stimulation;Therapeutic activities;Therapeutic exercise;Iontophoresis 4mg /ml Dexamethasone;Moist Heat;Cryotherapy;Ultrasound;Neuromuscular re-education;Manual techniques;Patient/family education;Dry needling;Spinal Manipulations;Joint Manipulations    PT Next Visit Plan  decreased wrist ext tension, periscapular strengthening, inc to wrist ext strengthening as able    PT Home Exercise Plan  wrist ext stretch, ice 49mins 2x/day    Consulted and Agree with Plan of Care  Patient       Patient will benefit from skilled therapeutic intervention in order to improve the following deficits and impairments:  Decreased mobility, Decreased range of motion, Impaired tone, Decreased activity tolerance, Decreased strength, Increased fascial restricitons, Impaired  flexibility, Pain  Visit Diagnosis: Lateral epicondylitis of right elbow     Problem List Patient Active Problem List   Diagnosis Date Noted  . Lateral epicondylitis of right elbow 10/24/2018  . Arthritis of hand 02/01/2018  . Vitamin D deficiency 02/01/2018  . Hyperlipidemia 02/01/2018  . Breast cancer screening 02/01/2018   [name] PT, DPT [name] 12/01/2018, 10:30 AM  [facility] [address] [place], [address] Phone: [phone]   Fax:  [phone]  Name: Jennifer Lang MRN: [id number] Date of Birth: 01/03/1961

## 2018-12-08 ENCOUNTER — Encounter: Payer: 59 | Admitting: Physical Therapy

## 2018-12-13 ENCOUNTER — Ambulatory Visit: Payer: 59 | Admitting: Physical Therapy

## 2018-12-15 ENCOUNTER — Ambulatory Visit: Payer: 59 | Admitting: Physical Therapy

## 2018-12-20 ENCOUNTER — Encounter: Payer: 59 | Admitting: Physical Therapy

## 2018-12-22 ENCOUNTER — Encounter: Payer: 59 | Admitting: Physical Therapy

## 2018-12-27 ENCOUNTER — Encounter: Payer: 59 | Admitting: Physical Therapy

## 2018-12-29 ENCOUNTER — Ambulatory Visit: Payer: 59 | Admitting: Physical Therapy

## 2019-01-03 ENCOUNTER — Encounter: Payer: 59 | Admitting: Physical Therapy

## 2019-01-05 ENCOUNTER — Encounter: Payer: 59 | Admitting: Physical Therapy

## 2019-01-10 ENCOUNTER — Encounter: Payer: 59 | Admitting: Physical Therapy

## 2019-01-12 ENCOUNTER — Encounter: Payer: 59 | Admitting: Physical Therapy

## 2019-03-03 ENCOUNTER — Encounter: Payer: Self-pay | Admitting: Family Medicine

## 2019-03-03 ENCOUNTER — Ambulatory Visit (INDEPENDENT_AMBULATORY_CARE_PROVIDER_SITE_OTHER): Payer: 59 | Admitting: Family Medicine

## 2019-03-03 ENCOUNTER — Other Ambulatory Visit: Payer: Self-pay

## 2019-03-03 ENCOUNTER — Other Ambulatory Visit (HOSPITAL_COMMUNITY)
Admission: RE | Admit: 2019-03-03 | Discharge: 2019-03-03 | Disposition: A | Discharge status: Home / Self Care | Payer: 59 | Source: Ambulatory Visit | Attending: Family Medicine | Admitting: Family Medicine

## 2019-03-03 VITALS — BP 122/72 | HR 82 | Temp 98.0°F | Resp 14 | Ht 62.0 in | Wt 134.3 lb

## 2019-03-03 DIAGNOSIS — Z1239 Encounter for other screening for malignant neoplasm of breast: Secondary | ICD-10-CM | POA: Diagnosis not present

## 2019-03-03 DIAGNOSIS — E785 Hyperlipidemia, unspecified: Secondary | ICD-10-CM

## 2019-03-03 DIAGNOSIS — Z01419 Encounter for gynecological examination (general) (routine) without abnormal findings: Secondary | ICD-10-CM

## 2019-03-03 DIAGNOSIS — E559 Vitamin D deficiency, unspecified: Secondary | ICD-10-CM

## 2019-03-03 DIAGNOSIS — Z124 Encounter for screening for malignant neoplasm of cervix: Secondary | ICD-10-CM

## 2019-03-03 NOTE — Patient Instructions (Addendum)
Kegel Exercises Kegel exercises help strengthen the muscles that support the rectum, vagina, small intestine, bladder, and uterus. Doing Kegel exercises can help:  Improve bladder and bowel control.  Improve sexual response.  Reduce problems and discomfort during pregnancy. Kegel exercises involve squeezing your pelvic floor muscles, which are the same muscles you squeeze when you try to stop the flow of urine. The exercises can be done while sitting, standing, or lying down, but it is best to vary your position. Exercises 1. Squeeze your pelvic floor muscles tight. You should feel a tight lift in your rectal area. If you are a female, you should also feel a tightness in your vaginal area. Keep your stomach, buttocks, and legs relaxed. 2. Hold the muscles tight for up to 10 seconds. 3. Relax your muscles. Repeat this exercise 50 times a day or as many times as told by your health care provider. Continue to do this exercise for at least 4-6 weeks or for as long as told by your health care provider. This information is not intended to replace advice given to you by your health care provider. Make sure you discuss any questions you have with your health care provider. Document Released: 11/16/2012 Document Revised: 04/12/2017 Document Reviewed: 10/20/2015 Elsevier Interactive Patient Education  2019 [place] Years, Female Preventive care refers to lifestyle choices and visits with your health care provider that can promote health and wellness. What does preventive care include?   A yearly physical exam. This is also called an annual well check.  Dental exams once or twice a year.  Routine eye exams. Ask your health care provider how often you should have your eyes checked.  Personal lifestyle choices, including: ? Daily care of your teeth and gums. ? Regular physical activity. ? Eating a healthy diet. ? Avoiding tobacco and drug use. ? Limiting alcohol  use. ? Practicing safe sex. ? Taking low-dose aspirin daily starting at age 79. ? Taking vitamin and mineral supplements as recommended by your health care provider. What happens during an annual well check? The services and screenings done by your health care provider during your annual well check will depend on your age, overall health, lifestyle risk factors, and family history of disease. Counseling Your health care provider may ask you questions about your:  Alcohol use.  Tobacco use.  Drug use.  Emotional well-being.  Home and relationship well-being.  Sexual activity.  Eating habits.  Work and work Statistician.  Method of birth control.  Menstrual cycle.  Pregnancy history. Screening You may have the following tests or measurements:  Height, weight, and BMI.  Blood pressure.  Lipid and cholesterol levels. These may be checked every 5 years, or more frequently if you are over 46 years old.  Skin check.  Lung cancer screening. You may have this screening every year starting at age 38 if you have a 30-pack-year history of smoking and currently smoke or have quit within the past 15 years.  Colorectal cancer screening. All adults should have this screening starting at age 25 and continuing until age 23. Your health care provider may recommend screening at age 30. You will have tests every 1-10 years, depending on your results and the type of screening test. People at increased risk should start screening at an earlier age. Screening tests may include: ? Guaiac-based fecal occult blood testing. ? Fecal immunochemical test (FIT). ? Stool DNA test. ? Virtual colonoscopy. ? Sigmoidoscopy. During this test, a flexible  tube with a tiny camera (sigmoidoscope) is used to examine your rectum and lower colon. The sigmoidoscope is inserted through your anus into your rectum and lower colon. ? Colonoscopy. During this test, a long, thin, flexible tube with a tiny camera  (colonoscope) is used to examine your entire colon and rectum.  Hepatitis C blood test.  Hepatitis B blood test.  Sexually transmitted disease (STD) testing.  Diabetes screening. This is done by checking your blood sugar (glucose) after you have not eaten for a while (fasting). You may have this done every 1-3 years.  Mammogram. This may be done every 1-2 years. Talk to your health care provider about when you should start having regular mammograms. This may depend on whether you have a family history of breast cancer.  BRCA-related cancer screening. This may be done if you have a family history of breast, ovarian, tubal, or peritoneal cancers.  Pelvic exam and Pap test. This may be done every 3 years starting at age 67. Starting at age 89, this may be done every 5 years if you have a Pap test in combination with an HPV test.  Bone density scan. This is done to screen for osteoporosis. You may have this scan if you are at high risk for osteoporosis. Discuss your test results, treatment options, and if necessary, the need for more tests with your health care provider. Vaccines Your health care provider may recommend certain vaccines, such as:  Influenza vaccine. This is recommended every year.  Tetanus, diphtheria, and acellular pertussis (Tdap, Td) vaccine. You may need a Td booster every 10 years.  Varicella vaccine. You may need this if you have not been vaccinated.  Zoster vaccine. You may need this after age 66.  Measles, mumps, and rubella (MMR) vaccine. You may need at least one dose of MMR if you were born in 1957 or later. You may also need a second dose.  Pneumococcal 13-valent conjugate (PCV13) vaccine. You may need this if you have certain conditions and were not previously vaccinated.  Pneumococcal polysaccharide (PPSV23) vaccine. You may need one or two doses if you smoke cigarettes or if you have certain conditions.  Meningococcal vaccine. You may need this if you  have certain conditions.  Hepatitis A vaccine. You may need this if you have certain conditions or if you travel or work in places where you may be exposed to hepatitis A.  Hepatitis B vaccine. You may need this if you have certain conditions or if you travel or work in places where you may be exposed to hepatitis B.  Haemophilus influenzae type b (Hib) vaccine. You may need this if you have certain conditions. Talk to your health care provider about which screenings and vaccines you need and how often you need them. This information is not intended to replace advice given to you by your health care provider. Make sure you discuss any questions you have with your health care provider. Document Released: 12/27/2015 Document Revised: 01/20/2018 Document Reviewed: 10/01/2015 Elsevier Interactive Patient Education  2019 [employer].

## 2019-03-03 NOTE — Progress Notes (Signed)
Name: Jennifer Lang   MRN: [id number]    DOB: May 14, 1961   Date:03/03/2019       Progress Note  Subjective  Chief Complaint  Chief Complaint  Patient presents with  . Annual Exam    HPI  Patient presents for annual CPE.  Diet: Balanced and healthy Exercise: Work is labor intensive, also does a lot of gardening.   USPSTF grade A and B recommendations    Office Visit from 03/03/2019 in [facility]  AUDIT-C Score  0     Depression:  Depression screen [facility] 2/9 03/03/2019 10/24/2018 03/01/2018 02/01/2018  Decreased Interest 0 0 0 0  Down, Depressed, Hopeless 0 0 0 0  PHQ - 2 Score 0 0 0 0  Altered sleeping 0 0 - -  Tired, decreased energy 0 0 - -  Change in appetite 0 0 - -  Feeling bad or failure about yourself  0 0 - -  Trouble concentrating 0 0 - -  Moving slowly or fidgety/restless 0 0 - -  Suicidal thoughts 0 0 - -  PHQ-9 Score 0 0 - -  Difficult doing work/chores Not difficult at all Not difficult at all - -   Hypertension: BP Readings from Last 3 Encounters:  03/03/19 122/72  10/24/18 116/62  03/01/18 110/70   Obesity: Wt Readings from Last 3 Encounters:  03/03/19 134 lb 4.8 oz (60.9 kg)  10/24/18 129 lb 4.8 oz (58.7 kg)  03/01/18 129 lb 4.8 oz (58.7 kg)   BMI Readings from Last 3 Encounters:  03/03/19 24.56 kg/m  10/24/18 23.65 kg/m  03/01/18 23.65 kg/m    Hep C Screening: Negative in 2019 STD testing and prevention (HIV/chl/gon/syphilis): Negative HIV in 2019, declines additional testing Intimate partner violence: No concerns Sexual History/Pain during Intercourse: No concerns Menstrual History/LMP/Abnormal Bleeding: LMP was about 5 years. No vaginal bleeding. Incontinence Symptoms: No issues except with her morning coffee.   Advanced Care Planning: A voluntary discussion about advance care planning including the explanation and discussion of advance directives.  Discussed health care proxy and Living will, and the patient was able  to identify a health care proxy as Sister Suzan Slick).  Patient does not have a living will at present time. If patient does have living will, I have requested they bring this to the clinic to be scanned in to their chart.  Breast cancer: Due for Mammogram No results found for: [facility]  BRCA gene screening: Mother had breast CA. Cervical cancer screening: We will check today.   Osteoporosis Screening: Taking Vitamin D daily, she gets enough calcium and is outside frequent.  She reports having DEXA many years ago and it was normal, declines until age 25.  No results found for: HMDEXASCAN  Lipids:  Lab Results  Component Value Date   CHOL 215 (H) 03/01/2018   Lab Results  Component Value Date   HDL 51 03/01/2018   Lab Results  Component Value Date   LDLCALC 141 (H) 03/01/2018   Lab Results  Component Value Date   TRIG 113 03/01/2018   Lab Results  Component Value Date   CHOLHDL 4.2 03/01/2018   No results found for: LDLDIRECT  Glucose:  Glucose, Bld  Date Value Ref Range Status  03/01/2018 84 65 - 99 mg/dL Final    Comment:    .            Fasting reference interval .    Skin cancer: No concerning lesions.  Does not wear  sunscreen except for on face.  Colorectal cancer: Denies family or personal history of colorectal cancer, no changes in BM's - no blood in stool, dark and tarry stool, mucus in stool, or constipation/diarrhea. Lung cancer:  Never Smoker. Low Dose CT Chest recommended if Age 96-80 years, 30 pack-year currently smoking OR have quit w/in 15years. Patient does not qualify.   ECG: Declines chest pain, shortness of breath, palpitations.   Patient Active Problem List   Diagnosis Date Noted  . Lateral epicondylitis of right elbow 10/24/2018  . Arthritis of hand 02/01/2018  . Vitamin D deficiency 02/01/2018  . Hyperlipidemia 02/01/2018  . Breast cancer screening 02/01/2018    Past Surgical History:  Procedure Laterality Date  . CESAREAN SECTION     . TUBAL LIGATION      Family History  Problem Relation Age of Onset  . Arthritis Mother   . Cancer Mother   . Alzheimer's disease Mother   . Hyperlipidemia Mother   . Breast cancer Mother   . Hypertension Father   . Breast cancer Paternal Aunt        Passed away in 63's from Breast CA  . Hyperlipidemia Sister   . Bladder Cancer Brother   . Prostate cancer Brother   . Kidney cancer Brother   . Stroke Maternal Grandmother   . Uterine cancer Paternal Grandmother     Social History   Socioeconomic History  . Marital status: Divorced    Spouse name: Not on file  . Number of children: 3  . Years of education: Not on file  . Highest education level: Associate degree: academic program  Occupational History  . Not on file  Social Needs  . Financial resource strain: Not on file  . Food insecurity:    Worry: Never true    Inability: Never true  . Transportation needs:    Medical: No    Non-medical: No  Tobacco Use  . Smoking status: Never Smoker  . Smokeless tobacco: Never Used  Substance and Sexual Activity  . Alcohol use: No    Frequency: Never  . Drug use: No  . Sexual activity: Not Currently    Partners: Male  Lifestyle  . Physical activity:    Days per week: 7 days    Minutes per session: 60 min  . Stress: Not at all  Relationships  . Social connections:    Talks on phone: More than three times a week    Gets together: Once a week    Attends religious service: Never    Active member of club or organization: No    Attends meetings of clubs or organizations: Never    Relationship status: Divorced  . Intimate partner violence:    Fear of current or ex partner: No    Emotionally abused: No    Physically abused: No    Forced sexual activity: No  Other Topics Concern  . Not on file  Social History Narrative   Lives with daughter and granddaughter.  Moved from [place] in August 2018. Works at [employer] at [employer] in [employer].     Current Outpatient  Medications:  .  cetirizine (ZYRTEC) 10 MG tablet, Take 10 mg by mouth daily., Disp: , Rfl:  .  cholecalciferol (VITAMIN D) 400 units TABS tablet, Take 400 Units by mouth., Disp: , Rfl:  .  meloxicam (MOBIC) 15 MG tablet, Take 0.5-1 tablets (7.5-15 mg total) by mouth daily., Disp: 30 tablet, Rfl: 0 .  Multiple Vitamin (MULTIVITAMIN)  tablet, Take 1 tablet by mouth daily., Disp: , Rfl:  .  Red Yeast Rice Extract (RED YEAST RICE PO), Take by mouth., Disp: , Rfl:   No Known Allergies   ROS  Constitutional: Negative for fever or weight change.  Respiratory: Negative for cough and shortness of breath.   Cardiovascular: Negative for chest pain or palpitations.  Gastrointestinal: Negative for abdominal pain, no bowel changes.  Musculoskeletal: Negative for gait problem or joint swelling.  Skin: Negative for rash.  Neurological: Negative for dizziness or headache.  No other specific complaints in a complete review of systems (except as listed in HPI above).  Objective  Vitals:   03/03/19 0830  BP: 122/72  Pulse: 82  Resp: 14  Temp: 98 F (36.7 C)  TempSrc: Oral  SpO2: 93%  Weight: 134 lb 4.8 oz (60.9 kg)  Height: '5\' 2"'$  (1.575 m)    Body mass index is 24.56 kg/m.  Physical Exam Constitutional: Patient appears well-developed and well-nourished. No distress.  HENT: Head: Normocephalic and atraumatic. Ears: B TMs ok, no erythema or effusion; Nose: Nose normal. Mouth/Throat: Oropharynx is clear and moist. No oropharyngeal exudate.  Eyes: Conjunctivae and EOM are normal. Pupils are equal, round, and reactive to light. No scleral icterus.  Neck: Normal range of motion. Neck supple. No JVD present. No thyromegaly present.  Cardiovascular: Normal rate, regular rhythm and normal heart sounds.  No murmur heard. No BLE edema. Pulmonary/Chest: Effort normal and breath sounds normal. No respiratory distress. Abdominal: Soft. Bowel sounds are normal, no distension. There is no tenderness. no  masses Breast: no lumps or masses, no nipple discharge or rashes FEMALE GENITALIA:  External genitalia normal External urethra normal Vaginal vault normal without discharge or lesions Cervix normal without discharge or lesions Bimanual exam normal without masses RECTAL: no rectal masses or hemorrhoids Musculoskeletal: Normal range of motion, no joint effusions. No gross deformities Neurological: he is alert and oriented to person, place, and time. No cranial nerve deficit. Coordination, balance, strength, speech and gait are normal.  Skin: Skin is warm and dry. No rash noted. No erythema.  Psychiatric: Patient has a normal mood and affect. behavior is normal. Judgment and thought content normal.  No results found for this or any previous visit (from the past 2160 hour(s)).  PHQ2/9: Depression screen [facility] 2/9 03/03/2019 10/24/2018 03/01/2018 02/01/2018  Decreased Interest 0 0 0 0  Down, Depressed, Hopeless 0 0 0 0  PHQ - 2 Score 0 0 0 0  Altered sleeping 0 0 - -  Tired, decreased energy 0 0 - -  Change in appetite 0 0 - -  Feeling bad or failure about yourself  0 0 - -  Trouble concentrating 0 0 - -  Moving slowly or fidgety/restless 0 0 - -  Suicidal thoughts 0 0 - -  PHQ-9 Score 0 0 - -  Difficult doing work/chores Not difficult at all Not difficult at all - -   Fall Risk: Fall Risk  03/03/2019 10/24/2018 03/01/2018 02/01/2018  Falls in the past year? 0 0 No No  Number falls in past yr: 0 0 - -  Injury with Fall? 0 - - -  Follow up Falls evaluation completed - - -   Assessment & Plan  1. Well woman exam - MM 3D SCREEN BREAST BILATERAL; Future - Cytology - PAP - Lipid panel - VITAMIN D 25 Hydroxy (Vit-D Deficiency, Fractures)  2. Breast cancer screening - MM 3D SCREEN BREAST BILATERAL; Future  3. Cervical  cancer screening - Cytology - PAP  4. Hyperlipidemia, unspecified hyperlipidemia type - Lipid panel  5. Vitamin D deficiency - VITAMIN D 25 Hydroxy (Vit-D  Deficiency, Fractures)  -USPSTF grade A and B recommendations reviewed with patient; age-appropriate recommendations, preventive care, screening tests, etc discussed and encouraged; healthy living encouraged; see AVS for patient education given to patient -Discussed importance of 150 minutes of physical activity weekly, eat two servings of fish weekly, eat one serving of tree nuts ( cashews, pistachios, pecans, almonds.Marland Kitchen) every other day, eat 6 servings of fruit/vegetables daily and drink plenty of water and avoid sweet beverages.

## 2019-03-04 LAB — LIPID PANEL
Cholesterol: 235 mg/dL — ABNORMAL HIGH (ref <200)
HDL: 58 mg/dL (ref >=50)
LDL Cholesterol (Calc): 158 mg/dL (calc) — ABNORMAL HIGH
NON-HDL CHOLESTEROL (CALC): 177 mg/dL — AB (ref <130)
Total CHOL/HDL Ratio: 4.1 (calc) (ref <5.0)
Triglycerides: 83 mg/dL (ref <150)

## 2019-03-04 LAB — VITAMIN D 25 HYDROXY (VIT D DEFICIENCY, FRACTURES): VIT D 25 HYDROXY: 39 ng/mL (ref 30–100)

## 2019-03-07 LAB — CYTOLOGY - PAP: HPV (WINDOPATH): NOT DETECTED

## 2019-06-28 ENCOUNTER — Other Ambulatory Visit: Payer: Self-pay

## 2019-06-28 ENCOUNTER — Ambulatory Visit
Admission: RE | Admit: 2019-06-28 | Discharge: 2019-06-28 | Disposition: A | Discharge status: Home / Self Care | Payer: 59 | Source: Ambulatory Visit | Attending: Family Medicine | Admitting: Family Medicine

## 2019-06-28 DIAGNOSIS — Z1239 Encounter for other screening for malignant neoplasm of breast: Secondary | ICD-10-CM

## 2019-06-28 DIAGNOSIS — Z1231 Encounter for screening mammogram for malignant neoplasm of breast: Secondary | ICD-10-CM | POA: Insufficient documentation

## 2019-06-28 DIAGNOSIS — Z01419 Encounter for gynecological examination (general) (routine) without abnormal findings: Secondary | ICD-10-CM | POA: Insufficient documentation

## 2019-07-12 ENCOUNTER — Ambulatory Visit (INDEPENDENT_AMBULATORY_CARE_PROVIDER_SITE_OTHER)
Admission: RE | Admit: 2019-07-12 | Discharge: 2019-07-12 | Disposition: A | Discharge status: Home / Self Care | Payer: 59 | Source: Ambulatory Visit

## 2019-07-12 DIAGNOSIS — Z20828 Contact with and (suspected) exposure to other viral communicable diseases: Secondary | ICD-10-CM

## 2019-07-12 DIAGNOSIS — J0100 Acute maxillary sinusitis, unspecified: Secondary | ICD-10-CM

## 2019-07-12 DIAGNOSIS — Z20822 Contact with and (suspected) exposure to covid-19: Secondary | ICD-10-CM

## 2019-07-12 MED ORDER — AMOXICILLIN-POT CLAVULANATE 875-125 MG PO TABS: 1.0000 | ORAL_TABLET | Freq: Two times a day (BID) | ORAL | 0 refills | Status: AC

## 2019-07-12 NOTE — ED Provider Notes (Signed)
[facility] Virtual Visit via Video Note:  Jennifer Lang  initiated request for Telemedicine visit with [facility] team. I connected with [name]  on 07/12/2019 at 12:52 PM  for a synchronized telemedicine visit using a telephone enabled HIPPA compliant telemedicine application. I verified that I am speaking with [name]  using two identifiers. Lestine Box, PA-C  was physically located in a [facility] Urgent care site and Aveyah Greenwood was located at a different location.   The limitations of evaluation and management by telemedicine as well as the availability of in-person appointments were discussed. Patient was informed that she  may incur a bill ( including co-pay) for this virtual visit encounter. [name]  expressed understanding and gave verbal consent to proceed with virtual visit.   [id number] 07/12/19 Arrival Time: 1234  CC: Sinusitis  SUBJECTIVE: History from: patient.  Jennifer Lang is a 58 y.o. female hx of arthritis, HLD, and vitamin D deficiency, who presents with sinus pain/ pressure, sore throat and fatigue x 3 days.  Denies sick exposure to COVID, flu or strep.  Denies recent travel.  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  Localizes sinus pain/ pressure over cheeks.  Has tried OTC medications with minimal relief.  Denies aggravating factors.  Reports previous symptoms in the past and diagnosed with sinus infections.  Complains of associated pain in teeth.   Denies fever, chills, rhinorrhea, congestion, cough, SOB, wheezing, chest pain, chest pressure, nausea, vomiting, changes in bowel or bladder habits.    ROS: As per HPI.  All other pertinent ROS negative.     Past Medical History:  Diagnosis Date  . Arthritis   . Hyperlipidemia   . Vitamin D deficiency    Past Surgical History:  Procedure Laterality Date  . CESAREAN SECTION    . TUBAL LIGATION     No Known Allergies No current facility-administered  medications on file prior to encounter.    Current Outpatient Medications on File Prior to Encounter  Medication Sig Dispense Refill  . cetirizine (ZYRTEC) 10 MG tablet Take 10 mg by mouth daily.    . cholecalciferol (VITAMIN D) 400 units TABS tablet Take 400 Units by mouth.    . Multiple Vitamin (MULTIVITAMIN) tablet Take 1 tablet by mouth daily.    . psyllium (METAMUCIL) 58.6 % packet Take 1 packet by mouth daily.      OBJECTIVE:  There were no vitals filed for this visit.   Video unavailable, PE done over telephone:  General: alert; no distress Throat: no hot potato voice Lungs: normal respiratory effort; speaking in full sentences without difficulty Neurologic: No slurred speech Psychological: alert and cooperative; normal mood and affect  ASSESSMENT & PLAN:  1. Acute non-recurrent maxillary sinusitis   2. Suspected Covid-19 Virus Infection     Meds ordered this encounter  Medications  . amoxicillin-clavulanate (AUGMENTIN) 875-125 MG tablet    Sig: Take 1 tablet by mouth 2 (two) times daily for 10 days.    Dispense:  20 tablet    Refill:  0    Order Specific Question:   Supervising Provider    Answer:   Raylene Everts [[id number]]    Day 3 of symptoms could be viral sinusitis, but due to [place] pandemic will treat for possible bacterial sinusitis.  However, cannot rule out COVID based on symptoms.  Please follow up with [facility] for further recommendations and testing.   Rest and push fluids Use OTC  zyrtec and flonase daily for symptomatic relief Augmentin prescribed.  Take as directed and to completion Continue with OTC ibuprofen/tylenol as needed for pain Follow up with PCP or in person if symptoms persists Follow up in person or go to the ED if you have any new or worsening symptoms such as fever, chills, worsening sinus pain/pressure, cough, sore throat, chest pain, shortness of breath, abdominal pain, changes in bowel or bladder habits, etc...   I  discussed the assessment and treatment plan with the patient. The patient was provided an opportunity to ask questions and all were answered. The patient agreed with the plan and demonstrated an understanding of the instructions.   The patient was advised to call back or seek an in-person evaluation if the symptoms worsen or if the condition fails to improve as anticipated.  I provided 10 minutes of non-face-to-face time during this encounter.  [place], PA-C  07/12/2019 12:52 PM          Lestine Box, PA-C 07/12/19 1255

## 2019-07-12 NOTE — Discharge Instructions (Addendum)
Day 3 of symptoms could be viral sinusitis, but due to [place] pandemic will treat for possible bacterial sinusitis.  However, cannot rule out COVID based on symptoms.  Please follow up with [facility] for further recommendations and testing.   Rest and push fluids Use OTC zyrtec and flonase daily for symptomatic relief Augmentin prescribed.  Take as directed and to completion Continue with OTC ibuprofen/tylenol as needed for pain Follow up with PCP or in person if symptoms persists Follow up in person or go to the ED if you have any new or worsening symptoms such as fever, chills, worsening sinus pain/pressure, cough, sore throat, chest pain, shortness of breath, abdominal pain, changes in bowel or bladder habits, etc..Marland Kitchen

## 2019-09-25 ENCOUNTER — Other Ambulatory Visit: Payer: Self-pay

## 2019-09-25 ENCOUNTER — Ambulatory Visit (INDEPENDENT_AMBULATORY_CARE_PROVIDER_SITE_OTHER): Payer: 59 | Admitting: Family Medicine

## 2019-09-25 ENCOUNTER — Ambulatory Visit
Admission: RE | Admit: 2019-09-25 | Discharge: 2019-09-25 | Disposition: A | Discharge status: Home / Self Care | Payer: 59 | Source: Ambulatory Visit | Attending: Family Medicine | Admitting: Family Medicine

## 2019-09-25 ENCOUNTER — Encounter: Payer: Self-pay | Admitting: Family Medicine

## 2019-09-25 VITALS — BP 120/72 | HR 81 | Temp 97.8°F | Resp 14 | Ht 62.0 in | Wt 139.1 lb

## 2019-09-25 DIAGNOSIS — R350 Frequency of micturition: Secondary | ICD-10-CM

## 2019-09-25 DIAGNOSIS — K219 Gastro-esophageal reflux disease without esophagitis: Secondary | ICD-10-CM | POA: Diagnosis not present

## 2019-09-25 DIAGNOSIS — R319 Hematuria, unspecified: Secondary | ICD-10-CM | POA: Diagnosis not present

## 2019-09-25 DIAGNOSIS — R1013 Epigastric pain: Secondary | ICD-10-CM | POA: Diagnosis not present

## 2019-09-25 LAB — POCT URINALYSIS DIPSTICK
Bilirubin, UA: NEGATIVE
Glucose, UA: NEGATIVE
Ketones, UA: NEGATIVE
Leukocytes, UA: NEGATIVE
Nitrite, UA: NEGATIVE
Protein, UA: POSITIVE — AB
Spec Grav, UA: 1.02 (ref 1.010–1.025)
Urobilinogen, UA: NEGATIVE E.U./dL — AB
pH, UA: 7.5 (ref 5.0–8.0)

## 2019-09-25 MED ORDER — FAMOTIDINE 20 MG PO TABS: 20.0000 mg | ORAL_TABLET | Freq: Two times a day (BID) | ORAL | 1 refills | Status: DC | PRN

## 2019-09-25 NOTE — Progress Notes (Signed)
Name: Jennifer Lang   MRN: [id number]    DOB: 1961-08-11   Date:09/25/2019       Progress Note  Subjective  Chief Complaint  Chief Complaint  Patient presents with  . Abdominal Pain    HPI  Pt presents with concern for abdominal pain for about a month.    Epigastric Pain: She has had intermittent upper abdominal/epigastric discomfort for years, but had a flare a few weeks ago.  Took pepcid and ate very bland and this did seem to help.  She denies nausea, vomiting; did have some loose stools when having flare, but this has since resolved.  No blood in stool or dark and tarry stools.  Has had endoscopy in the past and was told she had GERD.   Lower Abdominal Pain: Started about a month ago, low pelvic pressure with some low back discomfort.  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  Endorses urinary frequency but not urgency, no frank hematuria.  Denies history of kidney stones.   Patient Active Problem List   Diagnosis Date Noted  . Lateral epicondylitis of right elbow 10/24/2018  . Arthritis of hand 02/01/2018  . Vitamin D deficiency 02/01/2018  . Hyperlipidemia 02/01/2018  . Breast cancer screening 02/01/2018    Social History   Tobacco Use  . Smoking status: Never Smoker  . Smokeless tobacco: Never Used  Substance Use Topics  . Alcohol use: No    Frequency: Never     Current Outpatient Medications:  .  cetirizine (ZYRTEC) 10 MG tablet, Take 10 mg by mouth daily., Disp: , Rfl:  .  cholecalciferol (VITAMIN D) 400 units TABS tablet, Take 400 Units by mouth., Disp: , Rfl:  .  Multiple Vitamin (MULTIVITAMIN) tablet, Take 1 tablet by mouth daily., Disp: , Rfl:  .  psyllium (METAMUCIL) 58.6 % packet, Take 1 packet by mouth daily., Disp: , Rfl:   No Known Allergies  I personally reviewed active problem list, medication list, allergies, notes from last encounter, lab results with the  patient/caregiver today.  ROS  Ten systems reviewed and is negative except as mentioned in HPI  Objective  Vitals:   09/25/19 0936  BP: 120/72  Pulse: 81  Resp: 14  Temp: 97.8 F (36.6 C)  TempSrc: Oral  SpO2: 97%  Weight: 139 lb 1.6 oz (63.1 kg)  Height: 5\' 2"  (1.575 m)    Body mass index is 25.44 kg/m.  Nursing Note and Vital Signs reviewed.  Physical Exam   Constitutional: Patient appears well-developed and well-nourished. No distress.  HENT: Head: Normocephalic and atraumatic.  Eyes: Conjunctivae and EOM are normal. No scleral icterus.   Neck: Normal range of motion. Neck supple. No JVD present. Cardiovascular: Normal rate, regular rhythm and normal heart sounds.  No murmur heard. No BLE edema. Pulmonary/Chest: Effort normal and breath sounds normal. No respiratory distress. Abdominal: Soft. Bowel sounds are normal, no distension. There is some tenderness to the central lower abdomen only. No masses. Musculoskeletal: Normal range of motion, no joint effusions. No gross deformities Neurological: Pt is alert and oriented to person, place, and time. No cranial nerve deficit. Coordination, balance, strength, speech and gait are normal.  Skin: Skin is warm and dry. No rash noted. No erythema.  Psychiatric: Patient has a normal mood and affect. behavior is normal. Judgment and thought content normal.  Results for orders placed or performed in  visit on 09/25/19 (from the past 72 hour(s))  POCT urinalysis dipstick     Status: Abnormal   Collection Time: 09/25/19  9:43 AM  Result Value Ref Range   Color, UA yellow    Clarity, UA clear    Glucose, UA Negative Negative   Bilirubin, UA negative    Ketones, UA negative    Spec Grav, UA 1.020 1.010 - 1.025   Blood, UA large    pH, UA 7.5 5.0 - 8.0   Protein, UA Positive (A) Negative   Urobilinogen, UA negative (A) 0.2 or 1.0 E.U./dL   Nitrite, UA negative    Leukocytes, UA Negative Negative   Appearance clear    Odor  none     Assessment & Plan  1. Epigastric pain - Low FODMAP - H. pylori breath test - famotidine (PEPCID) 20 MG tablet; Take 1 tablet (20 mg total) by mouth 2 (two) times daily as needed for heartburn or indigestion.  Dispense: 180 tablet; Refill: 1  2. Urinary frequency - POCT urinalysis dipstick - DG Abd 1 View; Future - Urine Culture  3. Gastroesophageal reflux disease without esophagitis - H. pylori breath test - famotidine (PEPCID) 20 MG tablet; Take 1 tablet (20 mg total) by mouth 2 (two) times daily as needed for heartburn or indigestion.  Dispense: 180 tablet; Refill: 1  4. Hematuria, unspecified type - POCT urinalysis dipstick - DG Abd 1 View; Future - Urine Culture  -Red flags and when to present for emergency care or RTC including fever >101.5F, chest pain, shortness of breath, new/worsening/un-resolving symptoms, reviewed with patient at time of visit. Follow up and care instructions discussed and provided in AVS.

## 2019-09-25 NOTE — Patient Instructions (Signed)
Low-FODMAP Eating Plan  FODMAPs (fermentable oligosaccharides, disaccharides, monosaccharides, and polyols) are sugars that are hard for some people to digest. A low-FODMAP eating plan may help some people who have bowel (intestinal) diseases to manage their symptoms. This meal plan can be complicated to follow. Work with a diet and nutrition specialist (dietitian) to make a low-FODMAP eating plan that is right for you. A dietitian can make sure that you get enough nutrition from this diet. What are tips for following this plan? Reading food labels  Check labels for hidden FODMAPs such as: ? High-fructose syrup. ? Honey. ? Agave. ? Natural fruit flavors. ? Onion or garlic powder.  Choose low-FODMAP foods that contain 3-4 grams of fiber per serving.  Check food labels for serving sizes. Eat only one serving at a time to make sure FODMAP levels stay low. Meal planning  Follow a low-FODMAP eating plan for up to 6 weeks, or as told by your health care provider or dietitian.  To follow the eating plan: 1. Eliminate high-FODMAP foods from your diet completely. 2. Gradually reintroduce high-FODMAP foods into your diet one at a time. Most people should wait a few days after introducing one high-FODMAP food before they introduce the next high-FODMAP food. Your dietitian can recommend how quickly you may reintroduce foods. 3. Keep a daily record of what you eat and drink, and make note of any symptoms that you have after eating. 4. Review your daily record with a dietitian regularly. Your dietitian can help you identify which foods you can eat and which foods you should avoid. General tips  Drink enough fluid each day to keep your urine pale yellow.  Avoid processed foods. These often have added sugar and may be high in FODMAPs.  Avoid most dairy products, whole grains, and sweeteners.  Work with a dietitian to make sure you get enough fiber in your diet. Recommended foods Grains   Gluten-free grains, such as rice, oats, buckwheat, quinoa, corn, polenta, and millet. Gluten-free pasta, bread, or cereal. Rice noodles. Corn tortillas. Vegetables  Eggplant, zucchini, cucumber, peppers, green beans, Brussels sprouts, bean sprouts, lettuce, arugula, kale, Swiss chard, spinach, collard greens, bok choy, summer squash, potato, and tomato. Limited amounts of corn, carrot, and sweet potato. Green parts of scallions. Fruits  Bananas, oranges, lemons, limes, blueberries, raspberries, strawberries, grapes, cantaloupe, honeydew melon, kiwi, papaya, passion fruit, and pineapple. Limited amounts of dried cranberries, banana chips, and shredded coconut. Dairy  Lactose-free milk, yogurt, and kefir. Lactose-free cottage cheese and ice cream. Non-dairy milks, such as almond, coconut, hemp, and rice milk. Yogurts made of non-dairy milks. Limited amounts of goat cheese, brie, mozzarella, parmesan, swiss, and other hard cheeses. Meats and other protein foods  Unseasoned beef, pork, poultry, or fish. Eggs. Bacon. Tofu (firm) and tempeh. Limited amounts of nuts and seeds, such as almonds, walnuts, brazil nuts, pecans, peanuts, pumpkin seeds, chia seeds, and sunflower seeds. Fats and oils  Butter-free spreads. Vegetable oils, such as olive, canola, and sunflower oil. Seasoning and other foods  Artificial sweeteners with names that do not end in "ol" such as aspartame, saccharine, and stevia. Maple syrup, white table sugar, raw sugar, brown sugar, and molasses. Fresh basil, coriander, parsley, rosemary, and thyme. Beverages  Water and mineral water. Sugar-sweetened soft drinks. Small amounts of orange juice or cranberry juice. Black and green tea. Most dry wines. Coffee. This may not be a complete list of low-FODMAP foods. Talk with your dietitian for more information. Foods to avoid Grains  Wheat,   including kamut, durum, and semolina. Barley and bulgur. Couscous. Wheat-based cereals. Wheat  noodles, bread, crackers, and pastries. Vegetables  Chicory root, artichoke, asparagus, cabbage, snow peas, sugar snap peas, mushrooms, and cauliflower. Onions, garlic, leeks, and the white part of scallions. Fruits  Fresh, dried, and juiced forms of apple, pear, watermelon, peach, plum, cherries, apricots, blackberries, boysenberries, figs, nectarines, and mango. Avocado. Dairy  Milk, yogurt, ice cream, and soft cheese. Cream and sour cream. Milk-based sauces. Custard. Meats and other protein foods  Fried or fatty meat. Sausage. Cashews and pistachios. Soybeans, baked beans, black beans, chickpeas, kidney beans, fava beans, navy beans, lentils, and split peas. Seasoning and other foods  Any sugar-free gum or candy. Foods that contain artificial sweeteners such as sorbitol, mannitol, isomalt, or xylitol. Foods that contain honey, high-fructose corn syrup, or agave. Bouillon, vegetable stock, beef stock, and chicken stock. Garlic and onion powder. Condiments made with onion, such as hummus, chutney, pickles, relish, salad dressing, and salsa. Tomato paste. Beverages  Chicory-based drinks. Coffee substitutes. Chamomile tea. Fennel tea. Sweet or fortified wines such as port or sherry. Diet soft drinks made with isomalt, mannitol, maltitol, sorbitol, or xylitol. Apple, pear, and mango juice. Juices with high-fructose corn syrup. This may not be a complete list of high-FODMAP foods. Talk with your dietitian to discuss what dietary choices are best for you.  Summary  A low-FODMAP eating plan is a short-term diet that eliminates FODMAPs from your diet to help ease symptoms of certain bowel diseases.  The eating plan usually lasts up to 6 weeks. After that, high-FODMAP foods are restarted gradually, one at a time, so you can find out which may be causing symptoms.  A low-FODMAP eating plan can be complicated. It is best to work with a dietitian who has experience with this type of plan. This  information is not intended to replace advice given to you by your health care provider. Make sure you discuss any questions you have with your health care provider. Document Released: 07/27/2017 Document Revised: 11/12/2017 Document Reviewed: 07/27/2017 Elsevier Patient Education  2020 [employer] Inc.  

## 2019-09-27 LAB — URINE CULTURE
MICRO NUMBER:: 980261
Result:: NO GROWTH
SPECIMEN QUALITY:: ADEQUATE

## 2019-09-27 LAB — H. PYLORI BREATH TEST: H. pylori Breath Test: NOT DETECTED

## 2019-09-28 ENCOUNTER — Telehealth: Payer: Self-pay | Admitting: Emergency Medicine

## 2019-09-28 DIAGNOSIS — R350 Frequency of micturition: Secondary | ICD-10-CM

## 2019-09-28 DIAGNOSIS — R319 Hematuria, unspecified: Secondary | ICD-10-CM

## 2019-09-28 DIAGNOSIS — R1030 Lower abdominal pain, unspecified: Secondary | ICD-10-CM

## 2019-09-28 NOTE — Telephone Encounter (Signed)
Copied from [place] [id number]. Topic: General - Inquiry >> Sep 28, 2019 12:24 PM Virl Axe D wrote: Reason for CRM: Pt wants to let Raquel Sarna know she wants to have CT done. Please advise.

## 2019-09-28 NOTE — Telephone Encounter (Signed)
Please give order for CT

## 2019-09-28 NOTE — Telephone Encounter (Signed)
Ordered as STAT - prefer to be done either today or tomorrow. I did put in for some [facility] to [facility] today or tomorrow prior to her CT scan so that they have a kidney function and so we can evaluate for anemia or infection.

## 2019-09-29 NOTE — Telephone Encounter (Signed)
Patient would have been scheduled this morning @ 9:30 am but patient stated she would not be able to do today because of work and that she was only available on Mondays.  Patient is scheduled to go to [facility] [address] on Monday, 10/02/2019 at 9:30 AM. Provider was made aware this morning and a voice message with the date, time, and location was left on the patient's voicemail.

## 2019-09-29 NOTE — Telephone Encounter (Signed)
Miel please advise. You set this patient up for appointment

## 2019-10-02 ENCOUNTER — Ambulatory Visit
Admission: RE | Admit: 2019-10-02 | Discharge: 2019-10-02 | Disposition: A | Discharge status: Home / Self Care | Payer: 59 | Source: Ambulatory Visit | Attending: Family Medicine | Admitting: Family Medicine

## 2019-10-02 ENCOUNTER — Other Ambulatory Visit: Payer: Self-pay

## 2019-10-02 DIAGNOSIS — R350 Frequency of micturition: Secondary | ICD-10-CM | POA: Insufficient documentation

## 2019-10-02 DIAGNOSIS — R319 Hematuria, unspecified: Secondary | ICD-10-CM | POA: Diagnosis not present

## 2019-10-02 DIAGNOSIS — R1030 Lower abdominal pain, unspecified: Secondary | ICD-10-CM | POA: Diagnosis not present

## 2019-10-02 DIAGNOSIS — K5730 Diverticulosis of large intestine without perforation or abscess without bleeding: Secondary | ICD-10-CM | POA: Diagnosis not present

## 2019-10-09 DIAGNOSIS — L578 Other skin changes due to chronic exposure to nonionizing radiation: Secondary | ICD-10-CM | POA: Diagnosis not present

## 2019-10-09 DIAGNOSIS — L816 Other disorders of diminished melanin formation: Secondary | ICD-10-CM | POA: Diagnosis not present

## 2019-10-09 DIAGNOSIS — D225 Melanocytic nevi of trunk: Secondary | ICD-10-CM | POA: Diagnosis not present

## 2019-10-09 DIAGNOSIS — L821 Other seborrheic keratosis: Secondary | ICD-10-CM | POA: Diagnosis not present

## 2019-10-09 DIAGNOSIS — L814 Other melanin hyperpigmentation: Secondary | ICD-10-CM | POA: Diagnosis not present

## 2019-10-09 DIAGNOSIS — D1800 Hemangioma unspecified site: Secondary | ICD-10-CM | POA: Diagnosis not present

## 2019-10-09 DIAGNOSIS — I8393 Asymptomatic varicose veins of bilateral lower extremities: Secondary | ICD-10-CM | POA: Diagnosis not present

## 2019-10-09 DIAGNOSIS — Z1283 Encounter for screening for malignant neoplasm of skin: Secondary | ICD-10-CM | POA: Diagnosis not present

## 2019-10-09 DIAGNOSIS — L219 Seborrheic dermatitis, unspecified: Secondary | ICD-10-CM | POA: Diagnosis not present

## 2020-01-24 ENCOUNTER — Other Ambulatory Visit: Payer: Self-pay

## 2020-01-24 ENCOUNTER — Encounter: Payer: Self-pay | Admitting: Family Medicine

## 2020-01-24 ENCOUNTER — Other Ambulatory Visit (HOSPITAL_COMMUNITY)
Admission: RE | Admit: 2020-01-24 | Discharge: 2020-01-24 | Disposition: A | Discharge status: Home / Self Care | Payer: 59 | Source: Ambulatory Visit | Attending: Family Medicine | Admitting: Family Medicine

## 2020-01-24 ENCOUNTER — Ambulatory Visit (INDEPENDENT_AMBULATORY_CARE_PROVIDER_SITE_OTHER): Payer: 59 | Admitting: Family Medicine

## 2020-01-24 VITALS — BP 142/84 | HR 96 | Temp 97.6°F | Resp 14 | Ht 62.0 in | Wt 139.7 lb

## 2020-01-24 DIAGNOSIS — R350 Frequency of micturition: Secondary | ICD-10-CM

## 2020-01-24 DIAGNOSIS — R319 Hematuria, unspecified: Secondary | ICD-10-CM

## 2020-01-24 DIAGNOSIS — R300 Dysuria: Secondary | ICD-10-CM | POA: Diagnosis not present

## 2020-01-24 DIAGNOSIS — N8189 Other female genital prolapse: Secondary | ICD-10-CM

## 2020-01-24 DIAGNOSIS — N341 Nonspecific urethritis: Secondary | ICD-10-CM | POA: Diagnosis not present

## 2020-01-24 DIAGNOSIS — R87615 Unsatisfactory cytologic smear of cervix: Secondary | ICD-10-CM

## 2020-01-24 DIAGNOSIS — N761 Subacute and chronic vaginitis: Secondary | ICD-10-CM

## 2020-01-24 LAB — POCT URINALYSIS DIPSTICK
Bilirubin, UA: NEGATIVE
Glucose, UA: NEGATIVE
Ketones, UA: NEGATIVE
Leukocytes, UA: NEGATIVE
Nitrite, UA: NEGATIVE
Odor: NORMAL
Protein, UA: NEGATIVE
Spec Grav, UA: 1.02 (ref 1.010–1.025)
Urobilinogen, UA: 0.2 E.U./dL
pH, UA: 6.5 (ref 5.0–8.0)

## 2020-01-24 MED ORDER — PREMARIN 0.625 MG/GM VA CREA: 1.0000 | TOPICAL_CREAM | Freq: Every day | VAGINAL | 12 refills | Status: DC

## 2020-01-24 MED ORDER — MIRABEGRON ER 25 MG PO TB24: 25.0000 mg | ORAL_TABLET | Freq: Every day | ORAL | 5 refills | Status: DC

## 2020-01-24 NOTE — Patient Instructions (Addendum)
We will first be checking and ruling out infections  Try continuing to work on bladder training and try new med - let me know if not covered by insurance I'll send in a different medicine to try Meds ordered this encounter  Medications  . mirabegron ER (MYRBETRIQ) 25 MG TB24 tablet    Sig: Take 1 tablet (25 mg total) by mouth daily.    Dispense:  30 tablet    Refill:  5     Urinary Frequency, Adult Urinary frequency means urinating more often than usual. You may urinate every 1-2 hours even though you drink a normal amount of fluid and do not have a bladder infection or condition. Although you urinate more often than normal, the total amount of urine produced in a day is normal. With urinary frequency, you may have an urgent need to urinate often. The stress and anxiety of needing to find a bathroom quickly can make this urge worse. This condition may go away on its own or you may need treatment at home. Home treatment may include bladder training, exercises, taking medicines, or making changes to your diet. Follow these instructions at home: Bladder health   Keep a bladder diary if told by your health care provider. Keep track of: ? What you eat and drink. ? How often you urinate. ? How much you urinate.  Follow a bladder training program if told by your health care provider. This may include: ? Learning to delay going to the bathroom. ? Double urinating (voiding). This helps if you are not completely emptying your bladder. ? Scheduled voiding.  Do Kegel exercises as told by your health care provider. Kegel exercises strengthen the muscles that help control urination, which may help the condition. Eating and drinking  If told by your health care provider, make diet changes, such as: ? Avoiding caffeine. ? Drinking fewer fluids, especially alcohol. ? Not drinking in the evening. ? Avoiding foods or drinks that may irritate the bladder. These include coffee, tea, soda, artificial  sweeteners, citrus, tomato-based foods, and chocolate. ? Eating foods that help prevent or ease constipation. Constipation can make this condition worse. Your health care provider may recommend that you:  Drink enough fluid to keep your urine pale yellow.  Take over-the-counter or prescription medicines.  Eat foods that are high in fiber, such as beans, whole grains, and fresh fruits and vegetables.  Limit foods that are high in fat and processed sugars, such as fried or sweet foods. General instructions  Take over-the-counter and prescription medicines only as told by your health care provider.  Keep all follow-up visits as told by your health care provider. This is important. Contact a health care provider if:  You start urinating more often.  You feel pain or irritation when you urinate.  You notice blood in your urine.  Your urine looks cloudy.  You develop a fever.  You begin vomiting. Get help right away if:  You are unable to urinate. Summary  Urinary frequency means urinating more often than usual. With urinary frequency, you may urinate every 1-2 hours even though you drink a normal amount of fluid and do not have a bladder infection or other bladder condition.  Your health care provider may recommend that you keep a bladder diary, follow a bladder training program, or make dietary changes.  If told by your health care provider, do Kegel exercises to strengthen the muscles that help control urination.  Take over-the-counter and prescription medicines only as told  by your health care provider.  Contact a health care provider if your symptoms do not improve or get worse. This information is not intended to replace advice given to you by your health care provider. Make sure you discuss any questions you have with your health care provider. Document Revised: 06/09/2018 Document Reviewed: 06/09/2018 Elsevier Patient Education  [place].

## 2020-01-24 NOTE — Progress Notes (Signed)
Patient ID: Jennifer Lang, female    DOB: 04/24/1961, 59 y.o.   MRN: [id number]  PCP: Hubbard Hartshorn, FNP  Chief Complaint  Patient presents with  . Urinary Tract Infection    onset 2 weeks, symptoms include burning with urinations and state has little bit of discharge fron uretha not vaginal area?    Subjective:   Jennifer Lang is a 59 y.o. female, presents to clinic with CC of the following:  Urinary frequency for "a while" at least a year, since this am urinated at least 6 x She has dysuria which she describes as burning to urethra, and some possible discharge from urethra, she "knows its not vaginal discharge" no particular color, odor and no bloody discharge. Some genital/vaginal dryness She previously discussed similar sx with PCP did some lifestyle changes, decreased caffeine  She tries to do kegels No hematuria, no odor or cloudy, normal urination amounts just increased frequency over the past year.  Not much nocturia, but frequency, frequent dysuria, vaginal dryness, bladder pressure.   last pap needed to be repeated due to unsatisfactory sample - could not eval, has not yet been done due to COVID pandemic Not sexually active for years  Tubes tied, not other gyn surgery She hasnt' been to gyn in many many years, no urology eval, past bladder meds, no past bladder scans and she cannot remember last UTI/antibiotics/yeast infection  ua from today - reviewed results with pt - add on culture, microscopy and pt agrees to inspection of genitals/urethra today with some testing. Results for orders placed or performed in visit on 01/24/20  POCT urinalysis dipstick  Result Value Ref Range   Color, UA yellow    Clarity, UA clear    Glucose, UA Negative Negative   Bilirubin, UA neg    Ketones, UA neg    Spec Grav, UA 1.020 1.010 - 1.025   Blood, UA trace    pH, UA 6.5 5.0 - 8.0   Protein, UA Negative Negative   Urobilinogen, UA 0.2 0.2 or 1.0 E.U./dL   Nitrite, UA neg    Leukocytes, UA Negative Negative   Appearance clear    Odor normal      Patient Active Problem List   Diagnosis Date Noted  . Lateral epicondylitis of right elbow 10/24/2018  . Arthritis of hand 02/01/2018  . Vitamin D deficiency 02/01/2018  . Hyperlipidemia 02/01/2018  . Breast cancer screening 02/01/2018      Current Outpatient Medications:  .  cetirizine (ZYRTEC) 10 MG tablet, Take 10 mg by mouth daily., Disp: , Rfl:  .  cholecalciferol (VITAMIN D) 400 units TABS tablet, Take 400 Units by mouth., Disp: , Rfl:  .  famotidine (PEPCID) 20 MG tablet, Take 1 tablet (20 mg total) by mouth 2 (two) times daily as needed for heartburn or indigestion., Disp: 180 tablet, Rfl: 1 .  Multiple Vitamin (MULTIVITAMIN) tablet, Take 1 tablet by mouth daily., Disp: , Rfl:  .  psyllium (METAMUCIL) 58.6 % packet, Take 1 packet by mouth daily., Disp: , Rfl:    Allergies  Allergen Reactions  . Iodinated Diagnostic Agents Hives    Pt states she had a CT Scan and before the scan was completed she developed hives and was given Benadryl within a few minutes of the scan.      Family History  Problem Relation Age of Onset  . Arthritis Mother   . Alzheimer's disease Mother   . Hyperlipidemia Mother   . Breast cancer  Mother        79's  . Hypertension Father   . Breast cancer Paternal Aunt        Passed away in 69's from Breast CA  . Hyperlipidemia Sister   . Bladder Cancer Brother   . Prostate cancer Brother   . Kidney cancer Brother   . Stroke Maternal Grandmother   . Uterine cancer Paternal Grandmother      Social History   Socioeconomic History  . Marital status: Single    Spouse name: Not on file  . Number of children: 3  . Years of education: Not on file  . Highest education level: Associate degree: academic program  Occupational History  . Not on file  Tobacco Use  . Smoking status: Never Smoker  . Smokeless tobacco: Never Used  Substance and Sexual Activity  . Alcohol use:  No  . Drug use: No  . Sexual activity: Not Currently    Partners: Male  Other Topics Concern  . Not on file  Social History Narrative   Lives with daughter and granddaughter.  Moved from [place] in August 2018. Works at [employer] at [employer] in [employer].   Social Determinants of Health   Financial Resource Strain:   . Difficulty of Paying Living Expenses: Not on file  Food Insecurity:   . Worried About Charity fundraiser in the Last Year: Not on file  . Ran Out of Food in the Last Year: Not on file  Transportation Needs:   . Lack of Transportation (Medical): Not on file  . Lack of Transportation (Non-Medical): Not on file  Physical Activity:   . Days of Exercise per Week: Not on file  . Minutes of Exercise per Session: Not on file  Stress:   . Feeling of Stress : Not on file  Social Connections:   . Frequency of Communication with Friends and Family: Not on file  . Frequency of Social Gatherings with Friends and Family: Not on file  . Attends Religious Services: Not on file  . Active Member of Clubs or Organizations: Not on file  . Attends Archivist Meetings: Not on file  . Marital Status: Not on file  Intimate Partner Violence:   . Fear of Current or Ex-Partner: Not on file  . Emotionally Abused: Not on file  . Physically Abused: Not on file  . Sexually Abused: Not on file    Chart Review Today: I personally reviewed active problem list, medication list, allergies, family history, social history, health maintenance, notes from last encounter, lab results, imaging with the patient/caregiver today. Reviewed last urine testing, micro, PAP and CPE OV  - no abnormal pelvic, urethral or cervical finding noted   Review of Systems  Constitutional: Negative.   HENT: Negative.   Eyes: Negative.   Respiratory: Negative.   Cardiovascular: Negative.   Gastrointestinal: Negative.   Endocrine: Negative.   Genitourinary: Negative.   Musculoskeletal: Negative.     Skin: Negative.   Allergic/Immunologic: Negative.   Neurological: Negative.   Hematological: Negative.   Psychiatric/Behavioral: Negative.   All other systems reviewed and are negative.      Objective:   Vitals:   01/24/20 1139  BP: (!) 142/84  Pulse: 96  Resp: 14  Temp: 97.6 F (36.4 C)  SpO2: 97%  Weight: 139 lb 11.2 oz (63.4 kg)  Height: 5\' 2"  (1.575 m)    Body mass index is 25.55 kg/m.  Physical Exam Vitals and nursing note reviewed. Exam  conducted with a chaperone present.  Constitutional:      Appearance: She is well-developed.  HENT:     Head: Normocephalic and atraumatic.     Nose: Nose normal.  Eyes:     General:        Right eye: No discharge.        Left eye: No discharge.     Conjunctiva/sclera: Conjunctivae normal.  Neck:     Trachea: No tracheal deviation.  Cardiovascular:     Rate and Rhythm: Normal rate and regular rhythm.     Pulses: Normal pulses.     Heart sounds: Normal heart sounds.  Pulmonary:     Effort: Pulmonary effort is normal. No respiratory distress.     Breath sounds: Normal breath sounds. No stridor.  Abdominal:     General: Abdomen is flat. Bowel sounds are normal. There is no distension.     Palpations: Abdomen is soft.     Tenderness: There is no abdominal tenderness. There is no right CVA tenderness, left CVA tenderness, guarding or rebound.  Genitourinary:    General: Normal vulva.     Labia:        Right: No rash, tenderness or lesion.        Left: No rash, tenderness or lesion.      Urethra: Prolapse and urethral swelling present. No urethral pain or urethral lesion.     Vagina: Erythema and prolapsed vaginal walls present.       Comments: Introitus to urethra erythematous appears inflamed, visible collapsed vaginal walls w/o discharge Internal and external labia normal appearing Did not eval cervix or do exam with speculum Obtained vaginal swab and examined external genitalia and urethra per pt  Musculoskeletal:         General: Normal range of motion.  Skin:    General: Skin is warm and dry.     Findings: No rash.  Neurological:     Mental Status: She is alert.     Motor: No abnormal muscle tone.     Coordination: Coordination normal.  Psychiatric:        Mood and Affect: Mood normal.        Behavior: Behavior normal.      Results for orders placed or performed in visit on 09/25/19  Urine Culture   Specimen: Urine  Result Value Ref Range   MICRO NUMBER: [id number]    SPECIMEN QUALITY: Adequate    Sample Source URINE, CLEAN CATCH    STATUS: FINAL    Result: No Growth   H. pylori breath test  Result Value Ref Range   H. pylori Breath Test NOT DETECTED NOT DETECT  POCT urinalysis dipstick  Result Value Ref Range   Color, UA yellow    Clarity, UA clear    Glucose, UA Negative Negative   Bilirubin, UA negative    Ketones, UA negative    Spec Grav, UA 1.020 1.010 - 1.025   Blood, UA large    pH, UA 7.5 5.0 - 8.0   Protein, UA Positive (A) Negative   Urobilinogen, UA negative (A) 0.2 or 1.0 E.U./dL   Nitrite, UA negative    Leukocytes, UA Negative Negative   Appearance clear    Odor none         Assessment & Plan:      ICD-10-CM   1. Burning with urination  R30.0 POCT urinalysis dipstick    Urine Culture    Cervicovaginal ancillary only    Urinalysis, microscopic  only    Ambulatory referral to Gynecology  2. Urethritis, nonspecific  N34.1 Urine Culture    Cervicovaginal ancillary only    Urinalysis, microscopic only    Ambulatory referral to Gynecology  3. Urinary frequency  R35.0 Urine Culture    mirabegron ER (MYRBETRIQ) 25 MG TB24 tablet    Ambulatory referral to Gynecology    conjugated estrogens (PREMARIN) vaginal cream  4. Hematuria, unspecified type  R31.9 Urinalysis, microscopic only  5. Subacute vaginitis  N76.1 Ambulatory referral to Gynecology    conjugated estrogens (PREMARIN) vaginal cream  6. Pelvic floor weakness in female  N81.89 Ambulatory referral to  Gynecology  7. Unsatisfactory cytology of cervical Papanicolaou smear  R87.615 Ambulatory referral to Gynecology     Checking urine and vaginal swab for yeast/BV/bacteria Urethra and vagina appear to have sig degree of prolapse or at least pelvic floor weakness with erythematous tissues, external labia are not that atrophied, but pt states she feels very dry, not sexually active unlikely to be STDs Urinary sx may be due to OAB, atrophic vaginitis, pelvic floor changes, infection  UA here today only showed microscopic hematuria, otherwise neg, sent for microscopy and culture, no bladder ttp or flank pain, less likely UTI, last time with PCP with similar sx urine culture was neg  Can try myrbetric, kegels and premarin cream to see if it improves Feel GYN assessment would be very helpful - referred to [facility]  Will tx any pending tests if positive.  Pt had PAP about one year ago and it needed to be repeated.  I did discuss pelvic floor therapy, pessaries, meds to tx possible OAB, further work up and options reviewed -  Pt agrees to start trying OAB meds, premarin cream, f/up GYN, continue kegels and lifestyle changes for urinary frequency, we will tx anything that is significant with pending labs.  Delsa Grana, PA-C 01/24/20 11:44 AM

## 2020-01-25 LAB — URINALYSIS, MICROSCOPIC ONLY
Bacteria, UA: NONE SEEN /HPF
Hyaline Cast: NONE SEEN /LPF
RBC / HPF: NONE SEEN /HPF (ref 0–2)
Squamous Epithelial / LPF: NONE SEEN /HPF (ref <=5)
WBC, UA: NONE SEEN /HPF (ref 0–5)

## 2020-01-25 LAB — CERVICOVAGINAL ANCILLARY ONLY
Bacterial Vaginitis (gardnerella): NEGATIVE
Candida Glabrata: NEGATIVE
Candida Vaginitis: NEGATIVE
Comment: NEGATIVE
Comment: NEGATIVE
Comment: NEGATIVE

## 2020-01-25 LAB — URINE CULTURE
MICRO NUMBER:: 10138859
Result:: NO GROWTH
SPECIMEN QUALITY:: ADEQUATE

## 2020-01-26 ENCOUNTER — Telehealth: Payer: Self-pay | Admitting: Obstetrics & Gynecology

## 2020-01-26 NOTE — Telephone Encounter (Signed)
[facility] referring for Pelvic floor weakness, prolapse? Urinary frequency, needs PAP last pap did not have enough cells to eval. Called and left voicemail for patient to call back to be schedule

## 2020-01-29 NOTE — Telephone Encounter (Signed)
Called and left voice mail for patient to call back to be schedule °

## 2020-01-31 ENCOUNTER — Ambulatory Visit: Payer: 59 | Admitting: Family Medicine

## 2020-02-20 ENCOUNTER — Other Ambulatory Visit (HOSPITAL_COMMUNITY)
Admission: RE | Admit: 2020-02-20 | Discharge: 2020-02-20 | Disposition: A | Discharge status: Home / Self Care | Payer: 59 | Source: Ambulatory Visit | Attending: Obstetrics and Gynecology | Admitting: Obstetrics and Gynecology

## 2020-02-20 ENCOUNTER — Ambulatory Visit (INDEPENDENT_AMBULATORY_CARE_PROVIDER_SITE_OTHER): Payer: 59 | Admitting: Obstetrics and Gynecology

## 2020-02-20 ENCOUNTER — Encounter: Payer: Self-pay | Admitting: Obstetrics and Gynecology

## 2020-02-20 ENCOUNTER — Other Ambulatory Visit: Payer: Self-pay

## 2020-02-20 VITALS — BP 112/64 | Ht 62.0 in | Wt 139.0 lb

## 2020-02-20 DIAGNOSIS — Z124 Encounter for screening for malignant neoplasm of cervix: Secondary | ICD-10-CM | POA: Insufficient documentation

## 2020-02-20 DIAGNOSIS — R350 Frequency of micturition: Secondary | ICD-10-CM

## 2020-02-20 DIAGNOSIS — N9089 Other specified noninflammatory disorders of vulva and perineum: Secondary | ICD-10-CM | POA: Diagnosis not present

## 2020-02-20 DIAGNOSIS — N898 Other specified noninflammatory disorders of vagina: Secondary | ICD-10-CM | POA: Diagnosis not present

## 2020-02-20 NOTE — Patient Instructions (Signed)

## 2020-02-20 NOTE — Progress Notes (Signed)
Patient ID: Jennifer Lang, female   DOB: 1961-09-30, 59 y.o.   MRN: [id number]  Reason for Consult: Referral (Vaginal dryness, pretty frequent urination, needs pap smear)   Referred by Delsa Grana, PA-C  Subjective:     HPI:  Jennifer Lang is a 59 y.o. female.  She presents down there with complaints of feeling dry down there.  She reports that she has been urinating more often.  In a 8-hour workday she generally will urinate about 3 times.  She generally urinates once at night when she gets up to let out her dogs.  She feels irritated on her bottom and has some discomfort with urination.  She has been tested several times for urinary tract infection but the results of been negative.  She recently had a microscopic UA which was negative for red and white blood cells.  Urine culture at that time was negative.  In an attempt to improve her urination symptoms she has reduced caffeine and coffee intake.  This has helped improve some of her symptoms.  She has noticed on her underwear a small discharge that is white or clear from the urethra.  She reports feeling a sense that she always needs to go to the bathroom.  She has some urgency but has not had any urinary accidents.  She denies any symptoms of stress incontinence such as leakage with cough laugh or sneeze.  She reports that when she goes to the bathroom she voids normal amounts and feels like she is able to empty her bladder.  She is not sexually active but does not have a history of pain with sexual activity.  She denies any vaginal or vulvar itching discharge or bleeding.  She was prescribed Premarin cream in the past but did not initiate it because it is expensive.  She is started taking flaxseed oil as well as applying v-cream which was a homeopathic ointment she found online.  It is her preference to use more homeopathic methods.  She reports that the V cream is very thick and helps significantly control her symptoms of pain.  She has not  been having symptoms of hot flashes recently.  She does not have any symptoms of vaginal bulge or pressure.  Gynecological History Menarche: 27 Menopause: 8 to 10 years ago Describes periods as regular monthly in the past no issues with heavy bleeding moderate to small cramps.  No history of contraception use.  History of tubal ligation. Last pap smear: 2018 insufficient cells Last Mammogram: 2020 History of STDs: Denies Sexually Active: No  Obstetrical History History of 1 cesarean section and 2 vaginal deliveries. 4 female infant weighing 6 pounds 14 ounces delivered by cesarean section 22 female infant weighing 8 pounds 2 ounces spontaneous vaginal delivery 39 female infant weighing 7 pounds 14 ounces forceps delivery  Denies any complications with pregnancies does not recall any history of significant vaginal lacerations.  Past Medical History:  Diagnosis Date  . Arthritis   . Hyperlipidemia   . Vitamin D deficiency    Family History  Problem Relation Age of Onset  . Arthritis Mother   . Alzheimer's disease Mother   . Hyperlipidemia Mother   . Breast cancer Mother        86's  . Hypertension Father   . Breast cancer Paternal Aunt        Passed away in 36's from Breast CA  . Hyperlipidemia Sister   . Bladder Cancer Brother   . Prostate cancer Brother   .  Kidney cancer Brother   . Stroke Maternal Grandmother   . Uterine cancer Paternal Grandmother    Past Surgical History:  Procedure Laterality Date  . CESAREAN SECTION    . TUBAL LIGATION      Short Social History:  Social History   Tobacco Use  . Smoking status: Never Smoker  . Smokeless tobacco: Never Used  Substance Use Topics  . Alcohol use: No    Allergies  Allergen Reactions  . Iodinated Diagnostic Agents Hives    Pt states she had a CT Scan and before the scan was completed she developed hives and was given Benadryl within a few minutes of the scan.     Current Outpatient Medications    Medication Sig Dispense Refill  . cetirizine (ZYRTEC) 10 MG tablet Take 10 mg by mouth daily.    . cholecalciferol (VITAMIN D) 400 units TABS tablet Take 400 Units by mouth.    . conjugated estrogens (PREMARIN) vaginal cream Place 1 Applicatorful vaginally daily. 42.5 g 12  . famotidine (PEPCID) 20 MG tablet Take 1 tablet (20 mg total) by mouth 2 (two) times daily as needed for heartburn or indigestion. 180 tablet 1  . Flaxseed, Linseed, (FLAX SEEDS PO) Take by mouth.    . mirabegron ER (MYRBETRIQ) 25 MG TB24 tablet Take 1 tablet (25 mg total) by mouth daily. 30 tablet 5  . Multiple Vitamin (MULTIVITAMIN) tablet Take 1 tablet by mouth daily.    . psyllium (METAMUCIL) 58.6 % packet Take 1 packet by mouth daily.     No current facility-administered medications for this visit.    Review of Systems  Constitutional: Negative for chills, fatigue, fever and unexpected weight change.  HENT: Negative for trouble swallowing.  Eyes: Negative for loss of vision.  Respiratory: Negative for cough, shortness of breath and wheezing.  Cardiovascular: Negative for chest pain, leg swelling, palpitations and syncope.  GI: Negative for abdominal pain, blood in stool, diarrhea, nausea and vomiting.  GU: Negative for difficulty urinating, dysuria, frequency and hematuria.  Musculoskeletal: Negative for back pain, leg pain and joint pain.  Skin: Negative for rash.  Neurological: Negative for dizziness, headaches, light-headedness, numbness and seizures.  Psychiatric: Negative for behavioral problem, confusion, depressed mood and sleep disturbance.        Objective:  Objective   Vitals:   02/20/20 1030  BP: 112/64  Weight: 139 lb (63 kg)  Height: 5\' 2"  (1.575 m)   Body mass index is 25.42 kg/m.  Physical Exam Vitals and nursing note reviewed.  Constitutional:      Appearance: She is well-developed.  HENT:     Head: Normocephalic and atraumatic.  Eyes:     Pupils: Pupils are equal, round, and  reactive to light.  Cardiovascular:     Rate and Rhythm: Normal rate and regular rhythm.  Pulmonary:     Effort: Pulmonary effort is normal. No respiratory distress.  Genitourinary:    Comments: External: Normal appearing vulva. No lesions noted.  No significant atrophy or changes consistent with lichen No vulvar tenderness.  Patient is very sensitive to light touch at the area of the urethral orifice  Speculum examination: Stenotic appearing cervix. No blood in the vaginal vault.  No discharge.  On split speculum exam minimal prolapse of anterior vaginal wall consistent with stage I cystocele.  No evidence of apical prolapse or rectocele.   Skin:    General: Skin is warm and dry.  Neurological:     Mental Status: She is  alert and oriented to person, place, and time.  Psychiatric:        Behavior: Behavior normal.        Thought Content: Thought content normal.        Judgment: Judgment normal.         Assessment/Plan:     59 year old with symptoms of urinary frequency and vulvar/urethral pain Recommended patient continue with application of the over-the-counter cream which is helped her significantly.  Was able to provide patient with a sample of Premarin cream.  Will refer her to urogynecology for further investigation of her urethral sensitivity as well as complaints of urethral discharge.  Possible patient could have a urethral diverticulum which could be contributing to symptoms and pain. Recommended patient complete 3 days of a bladder diary.  Provided her with a bladder diary as well as a toilet hat for completion at home.  Patient can bring this with her to the urogynecologist or follow-up with me in the office to discuss when she is completed.   Discussed dietary changes that patient can make to avoid bladder irritants.  Provided patient with a handout regarding overactive bladder and potential treatment options if she is having symptoms of this which the bladder diary will be  revealing for. Minimal prolapse, stage I cystocele, on exam not bothersome to the patient.  No further management needed at this time.   More than 30 minutes were spent face to face with the patient in the room with more than 50% of the time spent providing counseling and discussing the plan of management.     Adrian Prows MD [facility], [facility] 02/20/2020 11:13 AM

## 2020-02-21 LAB — CYTOLOGY - PAP
Comment: NEGATIVE
Diagnosis: NEGATIVE
High risk HPV: NEGATIVE

## 2020-02-22 NOTE — Progress Notes (Signed)
Please call with normal test result

## 2020-03-07 ENCOUNTER — Ambulatory Visit: Payer: 59 | Attending: Internal Medicine

## 2020-03-07 DIAGNOSIS — Z20822 Contact with and (suspected) exposure to covid-19: Secondary | ICD-10-CM

## 2020-03-08 LAB — SARS-COV-2, NAA 2 DAY TAT

## 2020-03-08 LAB — NOVEL CORONAVIRUS, NAA: SARS-CoV-2, NAA: NOT DETECTED

## 2020-03-12 DIAGNOSIS — N952 Postmenopausal atrophic vaginitis: Secondary | ICD-10-CM | POA: Diagnosis not present

## 2020-03-12 DIAGNOSIS — R350 Frequency of micturition: Secondary | ICD-10-CM | POA: Diagnosis not present

## 2020-04-01 ENCOUNTER — Ambulatory Visit (INDEPENDENT_AMBULATORY_CARE_PROVIDER_SITE_OTHER): Payer: 59 | Admitting: Family Medicine

## 2020-04-01 ENCOUNTER — Other Ambulatory Visit: Payer: Self-pay

## 2020-04-01 ENCOUNTER — Encounter: Payer: Self-pay | Admitting: Family Medicine

## 2020-04-01 VITALS — BP 120/70 | HR 98 | Temp 97.8°F | Resp 14 | Ht 62.0 in | Wt 139.1 lb

## 2020-04-01 DIAGNOSIS — I878 Other specified disorders of veins: Secondary | ICD-10-CM | POA: Diagnosis not present

## 2020-04-01 DIAGNOSIS — M79605 Pain in left leg: Secondary | ICD-10-CM

## 2020-04-01 DIAGNOSIS — M79604 Pain in right leg: Secondary | ICD-10-CM

## 2020-04-01 NOTE — Patient Instructions (Signed)
Low salt diet, avoid prolonged period of immobility, compression devices, elevating legs   Chronic Venous Insufficiency Chronic venous insufficiency is a condition where the leg veins cannot effectively pump blood from the legs to the heart. This happens when the vein walls are either stretched, weakened, or damaged, or when the valves inside the vein are damaged. With the right treatment, you should be able to continue with an active life. This condition is also called venous stasis. What are the causes? Common causes of this condition include:  High blood pressure inside the veins (venous hypertension).  Sitting or standing too long, causing increased blood pressure in the leg veins.  A blood clot that blocks blood flow in a vein (deep vein thrombosis, DVT).  Inflammation of a vein (phlebitis) that causes a blood clot to form.  Tumors in the pelvis that cause blood to back up. What increases the risk? The following factors may make you more likely to develop this condition:  Having a family history of this condition.  Obesity.  Pregnancy.  Living without enough regular physical activity or exercise (sedentary lifestyle).  Smoking.  Having a job that requires long periods of standing or sitting in one place.  Being a certain age. Women in their 39s and 62s and men in their 59s are more likely to develop this condition. What are the signs or symptoms? Symptoms of this condition include:  Veins that are enlarged, bulging, or twisted (varicose veins).  Skin breakdown or ulcers.  Reddened skin or dark discoloration of skin on the leg between the knee and ankle.  Brown, smooth, tight, and painful skin just above the ankle, usually on the inside of the leg (lipodermatosclerosis).  Swelling of the legs. How is this diagnosed? This condition may be diagnosed based on:  Your medical history.  A physical exam.  Tests, such as: ? A procedure that creates an image of a  blood vessel and nearby organs and provides information about blood flow through the blood vessel (duplex ultrasound). ? A procedure that tests blood flow (plethysmography). ? A procedure that looks at the veins using X-ray and dye (venogram). How is this treated? The goals of treatment are to help you return to an active life and to minimize pain or disability. Treatment depends on the severity of your condition, and it may include:  Wearing compression stockings. These can help relieve symptoms and help prevent your condition from getting worse. However, they do not cure the condition.  Sclerotherapy. This procedure involves an injection of a solution that shrinks damaged veins.  Surgery. This may involve: ? Removing a diseased vein (vein stripping). ? Cutting off blood flow through the vein (laser ablation surgery). ? Repairing or reconstructing a valve within the affected vein. Follow these instructions at home:      Wear compression stockings as told by your health care provider. These stockings help to prevent blood clots and reduce swelling in your legs.  Take over-the-counter and prescription medicines only as told by your health care provider.  Stay active by exercising, walking, or doing different activities. Ask your health care provider what activities are safe for you and how much exercise you need.  Drink enough fluid to keep your urine pale yellow.  Do not use any products that contain nicotine or tobacco, such as cigarettes, e-cigarettes, and chewing tobacco. If you need help quitting, ask your health care provider.  Keep all follow-up visits as told by your health care provider. This is important.  Contact a health care provider if you:  Have redness, swelling, or more pain in the affected area.  See a red streak or line that goes up or down from the affected area.  Have skin breakdown or skin loss in the affected area, even if the breakdown is small.  Get an  injury in the affected area. Get help right away if:  You get an injury and an open wound in the affected area.  You have: ? Severe pain that does not get better with medicine. ? Sudden numbness or weakness in the foot or ankle below the affected area. ? Trouble moving your foot or ankle. ? A fever. ? Worse or persistent symptoms. ? Chest pain. ? Shortness of breath. Summary  Chronic venous insufficiency is a condition where the leg veins cannot effectively pump blood from the legs to the heart.  Chronic venous insufficiency occurs when the vein walls become stretched, weakened, or damaged, or when valves within the vein are damaged.  Treatment depends on how severe your condition is. It often involves wearing compression stockings and may involve having a procedure.  Make sure you stay active by exercising, walking, or doing different activities. Ask your health care provider what activities are safe for you and how much exercise you need. This information is not intended to replace advice given to you by your health care provider. Make sure you discuss any questions you have with your health care provider. Document Revised: 08/23/2018 Document Reviewed: 08/23/2018 Elsevier Patient Education  [place].

## 2020-04-01 NOTE — Progress Notes (Signed)
Patient ID: Jennifer Lang, female    DOB: [birth year], 60 y.o.   MRN: [id number]  PCP: Hubbard Hartshorn, FNP  Chief Complaint  Patient presents with  . Leg Pain    left, onset month, bottom of leg and back of thigh    Subjective:   Jennifer Lang is a 59 y.o. female, presents to clinic with CC of the following:  HPI  Leg pain bilaterally gradually worse over the past year and progressing over the past couple months, she is concerned with varicose veins.  Sx worse since change in her job  Achy pain to posterior right thigh feels like a bruise from the inside out  Yesterday worked 6 hours, and her leg pain was not as severe as a normal 8 to 8-1/2-hour day.  She has requested that she be able to take her 15-minute breaks at least twice a day in order to elevate her legs which does help ease the pain  She has gotten compression stockings thigh-high and also calf compression sleeves the compression for the lower legs helps with her symptoms and has improved her discomfort there significantly.  With the thigh-high stockings and compression devices she was unable to even get them on her body and she is concerned that she will have difficulty with this.  She tried an Ace wrap to give some compression to her left thigh but it slid down throughout the day.   Patient is not having very much swelling in her lower extremities currently and she has not tried low-salt diet.  She does notice the symptoms have worsened when she is been immobile.  She previously did a job where she walked a lot and for the last year she does a job now where she stands for longer periods of time this seem to correlate with the onset of her symptoms in the particular become worse over the past couple months.  She did call [facility] to try and get assessed there but they did inform her that she needed a referral which she is here for today.  Patient Active Problem List   Diagnosis Date Noted  . Lateral  epicondylitis of right elbow 10/24/2018  . Arthritis of hand 02/01/2018  . Vitamin D deficiency 02/01/2018  . Hyperlipidemia 02/01/2018  . Breast cancer screening 02/01/2018      Current Outpatient Medications:  .  cetirizine (ZYRTEC) 10 MG tablet, Take 10 mg by mouth daily., Disp: , Rfl:  .  cholecalciferol (VITAMIN D) 400 units TABS tablet, Take 400 Units by mouth., Disp: , Rfl:  .  conjugated estrogens (PREMARIN) vaginal cream, Place 1 Applicatorful vaginally daily., Disp: 42.5 g, Rfl: 12 .  famotidine (PEPCID) 20 MG tablet, Take 1 tablet (20 mg total) by mouth 2 (two) times daily as needed for heartburn or indigestion., Disp: 180 tablet, Rfl: 1 .  Flaxseed, Linseed, (FLAX SEEDS PO), Take by mouth., Disp: , Rfl:  .  Multiple Vitamin (MULTIVITAMIN) tablet, Take 1 tablet by mouth daily., Disp: , Rfl:  .  psyllium (METAMUCIL) 58.6 % packet, Take 1 packet by mouth daily., Disp: , Rfl:  .  mirabegron ER (MYRBETRIQ) 25 MG TB24 tablet, Take 1 tablet (25 mg total) by mouth daily. (Patient not taking: Reported on 04/01/2020), Disp: 30 tablet, Rfl: 5   Allergies  Allergen Reactions  . Iodinated Diagnostic Agents Hives    Pt states she had a CT Scan and before the scan was completed she developed hives and was  given Benadryl within a few minutes of the scan.      Family History  Problem Relation Age of Onset  . Arthritis Mother   . Alzheimer's disease Mother   . Hyperlipidemia Mother   . Breast cancer Mother        43's  . Hypertension Father   . Breast cancer Paternal Aunt        Passed away in 77's from Breast CA  . Hyperlipidemia Sister   . Bladder Cancer Brother   . Prostate cancer Brother   . Kidney cancer Brother   . Stroke Maternal Grandmother   . Uterine cancer Paternal Grandmother      Social History   Socioeconomic History  . Marital status: Single    Spouse name: Not on file  . Number of children: 3  . Years of education: Not on file  . Highest education level:  Associate degree: academic program  Occupational History  . Not on file  Tobacco Use  . Smoking status: Never Smoker  . Smokeless tobacco: Never Used  Substance and Sexual Activity  . Alcohol use: No  . Drug use: No  . Sexual activity: Not Currently    Partners: Male    Birth control/protection: Post-menopausal  Other Topics Concern  . Not on file  Social History Narrative   Lives with daughter and granddaughter.  Moved from [place] in August 2018. Works at [employer] at [employer] in [employer].   Social Determinants of Health   Financial Resource Strain:   . Difficulty of Paying Living Expenses:   Food Insecurity:   . Worried About Charity fundraiser in the Last Year:   . Arboriculturist in the Last Year:   Transportation Needs:   . Film/video editor (Medical):   Marland Kitchen Lack of Transportation (Non-Medical):   Physical Activity:   . Days of Exercise per Week:   . Minutes of Exercise per Session:   Stress:   . Feeling of Stress :   Social Connections:   . Frequency of Communication with Friends and Family:   . Frequency of Social Gatherings with Friends and Family:   . Attends Religious Services:   . Active Member of Clubs or Organizations:   . Attends Archivist Meetings:   Marland Kitchen Marital Status:   Intimate Partner Violence:   . Fear of Current or Ex-Partner:   . Emotionally Abused:   Marland Kitchen Physically Abused:   . Sexually Abused:     Chart Review Today: I personally reviewed active problem list, medication list, allergies, family history, social history, health maintenance, notes from last encounter, lab results, imaging with the patient/caregiver today.   Review of Systems 10 Systems reviewed and are negative for acute change except as noted in the HPI.     Objective:   Vitals:   04/01/20 0806  BP: 120/70  Pulse: 98  Resp: 14  Temp: 97.8 F (36.6 C)  SpO2: 99%  Weight: 139 lb 1.6 oz (63.1 kg)  Height: 5\' 2"  (1.575 m)    Body mass index is 25.44  kg/m.  Physical Exam Vitals and nursing note reviewed.  Constitutional:      General: She is not in acute distress.    Appearance: Normal appearance. She is well-developed and normal weight. She is not ill-appearing, toxic-appearing or diaphoretic.  HENT:     Head: Normocephalic and atraumatic.  Eyes:     General:        Right eye: No  discharge.        Left eye: No discharge.     Conjunctiva/sclera: Conjunctivae normal.  Neck:     Trachea: No tracheal deviation.  Cardiovascular:     Rate and Rhythm: Normal rate.  Pulmonary:     Effort: Pulmonary effort is normal. No respiratory distress.     Breath sounds: No stridor.  Musculoskeletal:     Right upper leg: No swelling, edema, deformity, lacerations, tenderness or bony tenderness.     Left upper leg: No swelling, edema, deformity, lacerations, tenderness or bony tenderness.     Comments: Posterior left thigh (diffuse to posterior b/l legs) with scattered reticular and varicose veins none are engorged enlarged, skin intact, no tenderness to palpation, no edema no erythema  Skin:    Coloration: Skin is not jaundiced or pale.     Findings: No rash.  Neurological:     Mental Status: She is alert.  Psychiatric:        Mood and Affect: Mood normal.        Behavior: Behavior normal.              Assessment & Plan:      ICD-10-CM   1. Pain in both lower extremities  M79.604 Ambulatory referral to Vascular Surgery   M79.605   2. Venous stasis of both lower extremities  I87.8 Ambulatory referral to Vascular Surgery     Patient was advised to avoid immobility and prolonged periods of stasis to walk frequently, elevate legs, uses low-salt diet and use compression stockings, follow-up with vascular specialist  Currently she has multiple reticular veins and some smaller varicose veins are not engorged she has no wounds or ulcers noted to her legs and no pitting edema.   Delsa Grana, PA-C 04/01/20 8:24 AM

## 2020-04-16 ENCOUNTER — Ambulatory Visit (INDEPENDENT_AMBULATORY_CARE_PROVIDER_SITE_OTHER): Payer: 59 | Admitting: Vascular Surgery

## 2020-04-16 ENCOUNTER — Other Ambulatory Visit: Payer: Self-pay

## 2020-04-16 VITALS — BP 122/83 | HR 80 | Ht 62.0 in | Wt 136.0 lb

## 2020-04-16 DIAGNOSIS — M79604 Pain in right leg: Secondary | ICD-10-CM

## 2020-04-16 DIAGNOSIS — M79605 Pain in left leg: Secondary | ICD-10-CM

## 2020-04-16 DIAGNOSIS — M79609 Pain in unspecified limb: Secondary | ICD-10-CM | POA: Insufficient documentation

## 2020-04-16 DIAGNOSIS — E785 Hyperlipidemia, unspecified: Secondary | ICD-10-CM

## 2020-04-16 NOTE — Patient Instructions (Signed)

## 2020-04-16 NOTE — Assessment & Plan Note (Signed)
lipid control important in reducing the progression of atherosclerotic disease.   

## 2020-04-16 NOTE — Progress Notes (Signed)
Patient ID: Jennifer Lang, female   DOB: 1961-01-06, 59 y.o.   MRN: [id number]  Chief Complaint  Patient presents with  . New Patient (Initial Visit)    BLE pain Ven stasis    HPI Jennifer Lang is a 59 y.o. female.  I am asked to see the patient by L. Tapia for evaluation of leg pain and varicose veins.  The patient reports a long standing history of superficial varicosities and they have become painful over time. There was no clear inciting event or causative factor that started the symptoms.  The left leg is more severly affected. The patient elevates the legs for relief. The pain is described as aching and burning particularly in the back of the leg. The symptoms are generally most severe in the evening, particularly when they have been on their feet for long periods of time.  A recent change in her job over the last several months has precipitated her standing more in her leg pain being much more noticeable.  Compression stockings has been used to try to improve the symptoms with some success. The patient complains of rare swelling as an associated symptom. The patient has no previous history of deep venous thrombosis or superficial thrombophlebitis to their knowledge.  She does have a strong family history with a mother who had severe varicose vein issues and had vein stripping.     Past Medical History:  Diagnosis Date  . Arthritis   . Hyperlipidemia   . Vitamin D deficiency     Past Surgical History:  Procedure Laterality Date  . CESAREAN SECTION    . TUBAL LIGATION      Family History  Problem Relation Age of Onset  . Arthritis Mother   . Alzheimer's disease Mother   . Hyperlipidemia Mother   . Breast cancer Mother        56's  . Hypertension Father   . Breast cancer Paternal Aunt        Passed away in 5's from Breast CA  . Hyperlipidemia Sister   . Bladder Cancer Brother   . Prostate cancer Brother   . Kidney cancer Brother   . Stroke Maternal Grandmother   .  Uterine cancer Paternal Grandmother   Mother had varicose vein issues   Social History   Tobacco Use  . Smoking status: Never Smoker  . Smokeless tobacco: Never Used  Substance Use Topics  . Alcohol use: No  . Drug use: No     Allergies  Allergen Reactions  . Iodinated Diagnostic Agents Hives    Pt states she had a CT Scan and before the scan was completed she developed hives and was given Benadryl within a few minutes of the scan.     Current Outpatient Medications  Medication Sig Dispense Refill  . cetirizine (ZYRTEC) 10 MG tablet Take 10 mg by mouth daily.    . cholecalciferol (VITAMIN D) 400 units TABS tablet Take 400 Units by mouth.    . conjugated estrogens (PREMARIN) vaginal cream Place 1 Applicatorful vaginally daily. 42.5 g 12  . estradiol (ESTRACE) 0.1 MG/GM vaginal cream     . famotidine (PEPCID) 20 MG tablet Take 1 tablet (20 mg total) by mouth 2 (two) times daily as needed for heartburn or indigestion. 180 tablet 1  . Flaxseed, Linseed, (FLAX SEEDS PO) Take by mouth.    . Multiple Vitamin (MULTIVITAMIN) tablet Take 1 tablet by mouth daily.    . psyllium (METAMUCIL) 58.6 % packet Take  1 packet by mouth daily.    . mirabegron ER (MYRBETRIQ) 25 MG TB24 tablet Take 1 tablet (25 mg total) by mouth daily. (Patient not taking: Reported on 04/01/2020) 30 tablet 5   No current facility-administered medications for this visit.      REVIEW OF SYSTEMS (Negative unless checked)  Constitutional: [] Weight loss  [] Fever  [] Chills Cardiac: [] Chest pain   [] Chest pressure   [] Palpitations   [] Shortness of breath when laying flat   [] Shortness of breath at rest   [] Shortness of breath with exertion. Vascular:  [x] Pain in legs with walking   [x] Pain in legs at rest   [] Pain in legs when laying flat   [] Claudication   [] Pain in feet when walking  [] Pain in feet at rest  [] Pain in feet when laying flat   [] History of DVT   [] Phlebitis   [] Swelling in legs   [x] Varicose veins    [] Non-healing ulcers Pulmonary:   [] Uses home oxygen   [] Productive cough   [] Hemoptysis   [] Wheeze  [] COPD   [] Asthma Neurologic:  [] Dizziness  [] Blackouts   [] Seizures   [] History of stroke   [] History of TIA  [] Aphasia   [] Temporary blindness   [] Dysphagia   [] Weakness or numbness in arms   [] Weakness or numbness in legs Musculoskeletal:  [] Arthritis   [] Joint swelling   [] Joint pain   [] Low back pain Hematologic:  [] Easy bruising  [] Easy bleeding   [] Hypercoagulable state   [] Anemic  [] Hepatitis Gastrointestinal:  [] Blood in stool   [] Vomiting blood  [] Gastroesophageal reflux/heartburn   [] Abdominal pain Genitourinary:  [] Chronic kidney disease   [] Difficult urination  [] Frequent urination  [] Burning with urination   [] Hematuria Skin:  [] Rashes   [] Ulcers   [] Wounds Psychological:  [] History of anxiety   []  History of major depression.    Physical Exam BP 122/83   Pulse 80   Ht 5\' 2"  (1.575 m)   Wt 136 lb (61.7 kg)   BMI 24.87 kg/m  Gen:  WD/WN, NAD. Appears younger than stated age. Head: [place]/AT, No temporalis wasting.  Ear/Nose/Throat: Hearing grossly intact, dentition good Eyes: Sclera non-icteric. Conjunctiva clear Neck: Supple. Trachea midline Pulmonary:  Good air movement, no use of accessory muscles, respirations not labored.  Cardiac: RRR, No JVD Vascular: Varicosities diffuse and measuring up to 1-2 mm in the right lower extremity        Varicosities diffuse and measuring up to 2 mm in the left lower extremity Vessel Right Left  Radial Palpable Palpable                          PT Palpable Palpable  DP Palpable Palpable   Gastrointestinal: soft, non-tender/non-distended.  Musculoskeletal: M/S 5/5 throughout.   No RLE edema.  No LLE edema Neurologic: Sensation grossly intact in extremities.  Symmetrical.  Speech is fluent.  Psychiatric: Judgment intact, Mood & affect appropriate for pt's clinical situation. Dermatologic: No rashes or ulcers noted.  No cellulitis  or open wounds.    Radiology No results found.  Labs Recent Results (from the past 2160 hour(s))  POCT urinalysis dipstick     Status: Normal   Collection Time: 01/24/20 11:45 AM  Result Value Ref Range   Color, UA yellow    Clarity, UA clear    Glucose, UA Negative Negative   Bilirubin, UA neg    Ketones, UA neg    Spec Grav, UA 1.020 1.010 - 1.025   Blood, UA  trace    pH, UA 6.5 5.0 - 8.0   Protein, UA Negative Negative   Urobilinogen, UA 0.2 0.2 or 1.0 E.U./dL   Nitrite, UA neg    Leukocytes, UA Negative Negative   Appearance clear    Odor normal   Cervicovaginal ancillary only     Status: None   Collection Time: 01/24/20 12:01 PM  Result Value Ref Range   Bacterial Vaginitis (gardnerella) Negative    Candida Vaginitis Negative    Candida Glabrata Negative    Comment      Normal Reference Range Bacterial Vaginosis - Negative   Comment Normal Reference Range Candida Species - Negative    Comment Normal Reference Range Candida Galbrata - Negative   Urine Culture     Status: None   Collection Time: 01/24/20 12:14 PM   Specimen: Urine  Result Value Ref Range   MICRO NUMBER: [id number]    SPECIMEN QUALITY: Adequate    Sample Source URINE    STATUS: FINAL    Result: No Growth   Urine Microscopic     Status: None   Collection Time: 01/24/20 12:14 PM  Result Value Ref Range   WBC, UA NONE SEEN 0 - 5 /HPF   RBC / HPF NONE SEEN 0 - 2 /HPF   Squamous Epithelial / LPF NONE SEEN < OR = 5 /HPF   Bacteria, UA NONE SEEN NONE SEEN /HPF   Hyaline Cast NONE SEEN NONE SEEN /LPF  Cytology - PAP     Status: None   Collection Time: 02/20/20 11:11 AM  Result Value Ref Range   High risk HPV Negative    Adequacy      Satisfactory for evaluation. The presence or absence of an   Adequacy      endocervical/transformation zone component cannot be determined because   Adequacy of atrophy.    Diagnosis      - Negative for intraepithelial lesion or malignancy (NILM)   Comment Normal  Reference Range HPV - Negative   Novel Coronavirus, NAA ([facility])     Status: None   Collection Time: 03/07/20 12:00 AM   Specimen: Nasopharyngeal(NP) swabs in vial transport medium   NASOPHARYNGE  TESTING  Result Value Ref Range   SARS-CoV-2, NAA Not Detected Not Detected    Comment: This nucleic acid amplification test was developed and its performance characteristics determined by [employer]. Nucleic acid amplification tests include RT-PCR and TMA. This test has not been FDA cleared or approved. This test has been authorized by FDA under an Emergency Use Authorization (EUA). This test is only authorized for the duration of time the declaration that circumstances exist justifying the authorization of the emergency use of in vitro diagnostic tests for detection of SARS-CoV-2 virus and/or diagnosis of COVID-19 infection under section 564(b)(1) of the Act, 21 U.S.C. [id number]) (1), unless the authorization is terminated or revoked sooner. When diagnostic testing is negative, the possibility of a false negative result should be considered in the context of a patient's recent exposures and the presence of clinical signs and symptoms consistent with COVID-19. An individual without symptoms of COVID-19 and who is not shedding SARS-CoV-2 virus wo uld expect to have a negative (not detected) result in this assay.   SARS-COV-2, NAA 2 DAY TAT     Status: None   Collection Time: 03/07/20 12:00 AM   NASOPHARYNGE  TESTING  Result Value Ref Range   SARS-CoV-2, NAA 2 DAY TAT Performed     Assessment/Plan:  Hyperlipidemia  lipid control important in reducing the progression of atherosclerotic disease.    Pain in limb   The patient has symptoms consistent with chronic venous insufficiency. We discussed the natural history and treatment options for venous disease. I recommended the regular use of 20 - 30 mm Hg compression stockings, and she has already benefited from the use of  compression stockings. I recommended leg elevation and anti-inflammatories as needed for pain. I have also recommended a complete venous duplex to assess the venous system for reflux or thrombotic issues. This can be done at the patient's convenience. I will see the patient back after the duplex to assess the response to conservative management, and determine further treatment options.  It was discussed that other issues could be causing her leg pain such as musculoskeletal issues or neurogenic issues such as sciatica and that if her venous reflux study was unrevealing, these could be assessed by her PCP.     Leotis Pain 04/16/2020, 12:12 PM   This note was created with Dragon medical transcription system.  Any errors from dictation are unintentional.

## 2020-04-23 ENCOUNTER — Ambulatory Visit (INDEPENDENT_AMBULATORY_CARE_PROVIDER_SITE_OTHER): Payer: 59 | Admitting: Nurse Practitioner

## 2020-04-23 ENCOUNTER — Other Ambulatory Visit: Payer: Self-pay

## 2020-04-23 ENCOUNTER — Ambulatory Visit (INDEPENDENT_AMBULATORY_CARE_PROVIDER_SITE_OTHER): Payer: 59

## 2020-04-23 ENCOUNTER — Encounter (INDEPENDENT_AMBULATORY_CARE_PROVIDER_SITE_OTHER): Payer: Self-pay | Admitting: Nurse Practitioner

## 2020-04-23 VITALS — BP 135/80 | HR 76 | Ht 62.0 in | Wt 138.0 lb

## 2020-04-23 DIAGNOSIS — M79605 Pain in left leg: Secondary | ICD-10-CM

## 2020-04-23 DIAGNOSIS — M79604 Pain in right leg: Secondary | ICD-10-CM | POA: Diagnosis not present

## 2020-04-23 DIAGNOSIS — I83892 Varicose veins of left lower extremities with other complications: Secondary | ICD-10-CM

## 2020-04-23 DIAGNOSIS — E785 Hyperlipidemia, unspecified: Secondary | ICD-10-CM

## 2020-04-25 ENCOUNTER — Encounter (INDEPENDENT_AMBULATORY_CARE_PROVIDER_SITE_OTHER): Payer: Self-pay | Admitting: Nurse Practitioner

## 2020-04-25 NOTE — Progress Notes (Signed)
Subjective:    Patient ID: Jennifer Lang, female    DOB: 1961/03/04, 59 y.o.   MRN: [id number] Chief Complaint  Patient presents with  . Follow-up    U/S Follow up    Jennifer Lang is a 59 y.o. female.  The presents today for evaluation of lower extremity leg pain as well as varicose veins.  Patient has a long history of varicosities that always bother her somewhat but has been tolerable.  However, the patient notices that recently she changed jobs and she is doing much more standing in 1 place, in fact greater than 10 hours and sometimes in she has noticed a pain and knot in the back of her leg and notices that the left lower extremity is much worse.  She states that utilizing compression stockings is somewhat helpful and that elevation has been helpful as well.  She denies any swelling on a regular basis.  She notes that the days where she has worked for longer timeframes, her leg seems to hurt worse.  The patient has worn medical grade 1 compression stockings for the last several months in addition to elevating her lower extremities and using over-the-counter medication for discomfort.  Today the right common femoral vein has evidence of reflux however there is no reflux present in the great saphenous vein.  Left lower extremity has reflux in the great saphenous vein at the saphenofemoral junction as well as at the mid thigh.  The vein diameter measures 0.10 cm at the mid thigh and 0.47 at the saphenofemoral junction.  There is no evidence of DVT or superficial venous thrombosis bilaterally.    Review of Systems  Musculoskeletal:       Leg pain  All other systems reviewed and are negative.      Objective:   Physical Exam Vitals reviewed.  Constitutional:      Appearance: Normal appearance.  Cardiovascular:     Pulses: Normal pulses.  Pulmonary:     Effort: Pulmonary effort is normal.     Breath sounds: Normal breath sounds.  Neurological:     Mental Status: She is alert and  oriented to person, place, and time.  Psychiatric:        Mood and Affect: Mood normal.        Behavior: Behavior normal.        Thought Content: Thought content normal.        Judgment: Judgment normal.     BP 135/80   Pulse 76   Ht 5\' 2"  (1.575 m)   Wt 138 lb (62.6 kg)   BMI 25.24 kg/m   Past Medical History:  Diagnosis Date  . Arthritis   . Hyperlipidemia   . Vitamin D deficiency     Social History   Socioeconomic History  . Marital status: Single    Spouse name: Not on file  . Number of children: 3  . Years of education: Not on file  . Highest education level: Associate degree: academic program  Occupational History  . Not on file  Tobacco Use  . Smoking status: Never Smoker  . Smokeless tobacco: Never Used  Substance and Sexual Activity  . Alcohol use: No  . Drug use: No  . Sexual activity: Not Currently    Partners: Male    Birth control/protection: Post-menopausal  Other Topics Concern  . Not on file  Social History Narrative   Lives with daughter and granddaughter.  Moved from [place] in August 2018. Works at [employer] at  [place] in [employer].   Social Determinants of Health   Financial Resource Strain:   . Difficulty of Paying Living Expenses:   Food Insecurity:   . Worried About Charity fundraiser in the Last Year:   . Arboriculturist in the Last Year:   Transportation Needs:   . Film/video editor (Medical):   Marland Kitchen Lack of Transportation (Non-Medical):   Physical Activity:   . Days of Exercise per Week:   . Minutes of Exercise per Session:   Stress:   . Feeling of Stress :   Social Connections:   . Frequency of Communication with Friends and Family:   . Frequency of Social Gatherings with Friends and Family:   . Attends Religious Services:   . Active Member of Clubs or Organizations:   . Attends Archivist Meetings:   Marland Kitchen Marital Status:   Intimate Partner Violence:   . Fear of Current or Ex-Partner:   . Emotionally  Abused:   Marland Kitchen Physically Abused:   . Sexually Abused:     Past Surgical History:  Procedure Laterality Date  . CESAREAN SECTION    . TUBAL LIGATION      Family History  Problem Relation Age of Onset  . Arthritis Mother   . Alzheimer's disease Mother   . Hyperlipidemia Mother   . Breast cancer Mother        99's  . Hypertension Father   . Breast cancer Paternal Aunt        Passed away in 52's from Breast CA  . Hyperlipidemia Sister   . Bladder Cancer Brother   . Prostate cancer Brother   . Kidney cancer Brother   . Stroke Maternal Grandmother   . Uterine cancer Paternal Grandmother     Allergies  Allergen Reactions  . Iodinated Diagnostic Agents Hives    Pt states she had a CT Scan and before the scan was completed she developed hives and was given Benadryl within a few minutes of the scan.        Assessment & Plan:   1. Varicose veins of left lower extremity with other complications  Recommend:  The patient has large symptomatic varicose veins that are painful and associated with swelling.  I have had a long discussion with the patient regarding  varicose veins and why they cause symptoms.  Patient will begin wearing graduated compression stockings class 1 on a daily basis, beginning first thing in the morning and removing them in the evening. The patient is instructed specifically not to sleep in the stockings.    The patient  will also begin using over-the-counter analgesics such as Motrin 600 mg po TID to help control the symptoms.    In addition, behavioral modification including elevation during the day will be initiated.    Pending the results of these changes the  patient will be reevaluated in three months.     Further plans will be based on whether conservative therapies are successful at eliminating the pain and swelling.   2. Pain in both lower extremities The pain is primarily in the left lower extremity.  Based upon the patient's description of pain it  is possible that this could be related to the varicosities however her veins are very small and access may be limited.  We will continue with conservative therapy as outlined above.  Depending on how conservative therapy relieves the patient's symptoms we may discuss proceeding with intervention.  Endovenous laser ablation or  possible foam sclerotherapy.  3. Hyperlipidemia, unspecified hyperlipidemia type Continue statin as ordered and reviewed, no changes at this time    Current Outpatient Medications on File Prior to Visit  Medication Sig Dispense Refill  . cetirizine (ZYRTEC) 10 MG tablet Take 10 mg by mouth daily.    . cholecalciferol (VITAMIN D) 400 units TABS tablet Take 400 Units by mouth.    . conjugated estrogens (PREMARIN) vaginal cream Place 1 Applicatorful vaginally daily. 42.5 g 12  . estradiol (ESTRACE) 0.1 MG/GM vaginal cream     . famotidine (PEPCID) 20 MG tablet Take 1 tablet (20 mg total) by mouth 2 (two) times daily as needed for heartburn or indigestion. 180 tablet 1  . Flaxseed, Linseed, (FLAX SEEDS PO) Take by mouth.    . mirabegron ER (MYRBETRIQ) 25 MG TB24 tablet Take 1 tablet (25 mg total) by mouth daily. 30 tablet 5  . Multiple Vitamin (MULTIVITAMIN) tablet Take 1 tablet by mouth daily.    . psyllium (METAMUCIL) 58.6 % packet Take 1 packet by mouth daily.     No current facility-administered medications on file prior to visit.    There are no Patient Instructions on file for this visit. No follow-ups on file.   Kris Hartmann, NP

## 2020-05-29 ENCOUNTER — Other Ambulatory Visit: Payer: Self-pay | Admitting: Family Medicine

## 2020-05-29 DIAGNOSIS — Z1231 Encounter for screening mammogram for malignant neoplasm of breast: Secondary | ICD-10-CM

## 2020-07-02 ENCOUNTER — Ambulatory Visit
Admission: RE | Admit: 2020-07-02 | Discharge: 2020-07-02 | Disposition: A | Discharge status: Home / Self Care | Payer: 59 | Source: Ambulatory Visit | Attending: Family Medicine | Admitting: Family Medicine

## 2020-07-02 DIAGNOSIS — Z1231 Encounter for screening mammogram for malignant neoplasm of breast: Secondary | ICD-10-CM | POA: Diagnosis not present

## 2020-07-20 ENCOUNTER — Ambulatory Visit
Admission: EM | Admit: 2020-07-20 | Discharge: 2020-07-20 | Disposition: A | Discharge status: Home / Self Care | Payer: 59 | Attending: Physician Assistant | Admitting: Physician Assistant

## 2020-07-20 ENCOUNTER — Other Ambulatory Visit: Payer: Self-pay

## 2020-07-20 ENCOUNTER — Encounter: Payer: Self-pay | Admitting: Emergency Medicine

## 2020-07-20 ENCOUNTER — Ambulatory Visit: Payer: Self-pay

## 2020-07-20 DIAGNOSIS — H60311 Diffuse otitis externa, right ear: Secondary | ICD-10-CM

## 2020-07-20 MED ORDER — OFLOXACIN 0.3 % OT SOLN: 10.0000 [drp] | Freq: Every day | OTIC | 0 refills | Status: AC

## 2020-07-20 NOTE — ED Provider Notes (Signed)
[facility]    CSN: [id number] Arrival date & time: 07/20/20  1156      History   Chief Complaint Chief Complaint  Patient presents with   Otalgia    HPI Jennifer Lang is a 59 y.o. female.   59 year old female comes in for 3 day history of right ear pain. Points to tragus when talking about pain. Denies injury/trauma. Denies changes in hearing, ear drainage. Denies URI symptoms. Pain can radiate to posterior ear/jaw. Denies fever. Frequent swimming.      Past Medical History:  Diagnosis Date   Arthritis    Hyperlipidemia    Vitamin D deficiency     Patient Active Problem List   Diagnosis Date Noted   Pain in limb 04/16/2020   Lateral epicondylitis of right elbow 10/24/2018   Arthritis of hand 02/01/2018   Vitamin D deficiency 02/01/2018   Hyperlipidemia 02/01/2018   Breast cancer screening 02/01/2018    Past Surgical History:  Procedure Laterality Date   CESAREAN SECTION     TUBAL LIGATION      OB History    Gravida  3   Para  3   Term  3   Preterm      AB      Living  3     SAB      TAB      Ectopic      Multiple      Live Births  3            Home Medications    Prior to Admission medications   Medication Sig Start Date End Date Taking? Authorizing Provider  cetirizine (ZYRTEC) 10 MG tablet Take 10 mg by mouth daily.    [provider]  cholecalciferol (VITAMIN D) 400 units TABS tablet Take 400 Units by mouth.    [provider]  conjugated estrogens (PREMARIN) vaginal cream Place 1 Applicatorful vaginally daily. 01/24/20   Delsa Grana, PA-C  estradiol (ESTRACE) 0.1 MG/GM vaginal cream  03/12/20   [provider]  famotidine (PEPCID) 20 MG tablet Take 1 tablet (20 mg total) by mouth 2 (two) times daily as needed for heartburn or indigestion. 09/25/19   Hubbard Hartshorn, FNP  Flaxseed, Linseed, (FLAX SEEDS PO) Take by mouth.    [provider]  mirabegron ER (MYRBETRIQ) 25  MG TB24 tablet Take 1 tablet (25 mg total) by mouth daily. 01/24/20   Delsa Grana, PA-C  Multiple Vitamin (MULTIVITAMIN) tablet Take 1 tablet by mouth daily.    [provider]  ofloxacin (FLOXIN) 0.3 % OTIC solution Place 10 drops into the right ear daily for 7 days. 07/20/20 07/27/20  Tasia Catchings, Onedia Vargus V, PA-C  psyllium (METAMUCIL) 58.6 % packet Take 1 packet by mouth daily.    [provider]    Family History Family History  Problem Relation Age of Onset   Arthritis Mother    Alzheimer's disease Mother    Hyperlipidemia Mother    Breast cancer Mother        68's   Hypertension Father    Breast cancer Paternal Aunt        Passed away in 70's from Breast CA   Hyperlipidemia Sister    Bladder Cancer Brother    Prostate cancer Brother    Kidney cancer Brother    Stroke Maternal Grandmother    Uterine cancer Paternal Grandmother     Social History Social History   Tobacco Use   Smoking  status: Never Smoker   Smokeless tobacco: Never Used  Vaping Use   Vaping Use: Never used  Substance Use Topics   Alcohol use: No   Drug use: No     Allergies   Iodinated diagnostic agents   Review of Systems Review of Systems  Reason unable to perform ROS: See HPI as above.     Physical Exam Triage Vital Signs ED Triage Vitals  Enc Vitals Group     BP 07/20/20 1228 121/73     Pulse Rate 07/20/20 1228 80     Resp 07/20/20 1228 18     Temp 07/20/20 1228 98.3 F (36.8 C)     Temp Source 07/20/20 1228 Oral     SpO2 07/20/20 1228 98 %     Weight --      Height --      Head Circumference --      Peak Flow --      Pain Score 07/20/20 1229 4     Pain Loc --      Pain Edu? --      Excl. in [place]? --    No data found.  Updated Vital Signs BP 121/73 (BP Location: Left Arm)    Pulse 80    Temp 98.3 F (36.8 C) (Oral)    Resp 18    SpO2 98%   Physical Exam Constitutional:      General: She is not in acute distress.    Appearance: Normal appearance. She  is well-developed. She is not toxic-appearing or diaphoretic.  HENT:     Head: Normocephalic and atraumatic.     Ears:     Comments: Mild right tragal tenderness. Right ear canal swelling with erythema, no active drainage. TM visible without erythema, bulging.  Left ear exam normal. Eyes:     Conjunctiva/sclera: Conjunctivae normal.     Pupils: Pupils are equal, round, and reactive to light.  Pulmonary:     Effort: Pulmonary effort is normal. No respiratory distress.  Musculoskeletal:     Cervical back: Normal range of motion and neck supple.  Skin:    General: Skin is warm and dry.  Neurological:     Mental Status: She is alert and oriented to person, place, and time.      UC Treatments / Results  Labs (all labs ordered are listed, but only abnormal results are displayed) Labs Reviewed - No data to display  EKG   Radiology No results found.  Procedures Procedures (including critical Lang time)  Medications Ordered in UC Medications - No data to display  Initial Impression / Assessment and Plan / UC Course  I have reviewed the triage vital signs and the nursing notes.  Pertinent labs & imaging results that were available during my Lang of the patient were reviewed by me and considered in my medical decision making (see chart for details).    Ofloxacin as directed. Continue to monitor symptoms. Return precautions given.  Final Clinical Impressions(s) / UC Diagnoses   Final diagnoses:  Acute diffuse otitis externa of right ear    ED Prescriptions    Medication Sig Dispense Auth. Provider   ofloxacin (FLOXIN) 0.3 % OTIC solution Place 10 drops into the right ear daily for 7 days. 3.5 mL Ok Edwards, PA-C     PDMP not reviewed this encounter.   Ok Edwards, PA-C 07/20/20 1323

## 2020-07-20 NOTE — ED Triage Notes (Signed)
Pt sts right ear lobe pain x 3 days

## 2020-07-20 NOTE — Discharge Instructions (Signed)
Start ofloxacin as directed. Avoid swimming, cotton swab for now. Follow up with PCP if symptoms not improving.

## 2020-07-23 ENCOUNTER — Ambulatory Visit (INDEPENDENT_AMBULATORY_CARE_PROVIDER_SITE_OTHER): Payer: 59 | Admitting: Vascular Surgery

## 2020-08-16 DIAGNOSIS — M7631 Iliotibial band syndrome, right leg: Secondary | ICD-10-CM | POA: Diagnosis not present

## 2020-08-16 DIAGNOSIS — M25551 Pain in right hip: Secondary | ICD-10-CM | POA: Diagnosis not present

## 2020-08-16 DIAGNOSIS — M545 Low back pain: Secondary | ICD-10-CM | POA: Diagnosis not present

## 2020-08-16 DIAGNOSIS — M7061 Trochanteric bursitis, right hip: Secondary | ICD-10-CM | POA: Diagnosis not present

## 2021-02-24 ENCOUNTER — Encounter: Payer: 59 | Admitting: Family Medicine

## 2021-04-24 ENCOUNTER — Other Ambulatory Visit: Payer: Self-pay

## 2021-04-24 ENCOUNTER — Ambulatory Visit (INDEPENDENT_AMBULATORY_CARE_PROVIDER_SITE_OTHER): Payer: 59 | Admitting: Family Medicine

## 2021-04-24 ENCOUNTER — Encounter: Payer: Self-pay | Admitting: Family Medicine

## 2021-04-24 VITALS — BP 112/70 | HR 91 | Temp 98.1°F | Resp 16 | Ht 62.0 in | Wt 138.4 lb

## 2021-04-24 DIAGNOSIS — Z23 Encounter for immunization: Secondary | ICD-10-CM | POA: Diagnosis not present

## 2021-04-24 DIAGNOSIS — N952 Postmenopausal atrophic vaginitis: Secondary | ICD-10-CM

## 2021-04-24 DIAGNOSIS — Z0000 Encounter for general adult medical examination without abnormal findings: Secondary | ICD-10-CM

## 2021-04-24 DIAGNOSIS — E785 Hyperlipidemia, unspecified: Secondary | ICD-10-CM

## 2021-04-24 DIAGNOSIS — Z1231 Encounter for screening mammogram for malignant neoplasm of breast: Secondary | ICD-10-CM | POA: Diagnosis not present

## 2021-04-24 MED ORDER — ESTRADIOL 0.1 MG/GM VA CREA: 1.0000 | TOPICAL_CREAM | VAGINAL | 3 refills | Status: DC

## 2021-04-24 MED ORDER — ZOSTER VAC RECOMB ADJUVANTED 50 MCG/0.5ML IM SUSR
0.5000 mL | Freq: Once | INTRAMUSCULAR | 1 refills | Status: AC
  Filled 2021-04-24: qty 0.5, 1d supply, fill #0

## 2021-04-24 NOTE — Patient Instructions (Signed)
Health Maintenance  Topic Date Due  . COVID-19 Vaccine (1) Never done  . Mammogram  07/02/2021  . Flu Shot  07/14/2021  . Colon Cancer Screening  12/14/2022  . Pap Smear  02/20/2023  . Tetanus Vaccine  10/15/2027  . Hepatitis C Screening: USPSTF Recommendation to screen - Ages 59-61 yo.  Completed  . HIV Screening  Completed  . HPV Vaccine  Aged Out   Shingles vaccine has been ordered for you to get at your pharmacy if you would like.  We recommend it!   Preventive Care 26-75 Years Old, Female Preventive care refers to lifestyle choices and visits with your health care provider that can promote health and wellness. This includes:  A yearly physical exam. This is also called an annual wellness visit.  Regular dental and eye exams.  Immunizations.  Screening for certain conditions.  Healthy lifestyle choices, such as: ? Eating a healthy diet. ? Getting regular exercise. ? Not using drugs or products that contain nicotine and tobacco. ? Limiting alcohol use. What can I expect for my preventive care visit? Physical exam Your health care provider will check your:  Height and weight. These may be used to calculate your BMI (body mass index). BMI is a measurement that tells if you are at a healthy weight.  Heart rate and blood pressure.  Body temperature.  Skin for abnormal spots. Counseling Your health care provider may ask you questions about your:  Past medical problems.  Family's medical history.  Alcohol, tobacco, and drug use.  Emotional well-being.  Home life and relationship well-being.  Sexual activity.  Diet, exercise, and sleep habits.  Work and work Statistician.  Access to firearms.  Method of birth control.  Menstrual cycle.  Pregnancy history. What immunizations do I need? Vaccines are usually given at various ages, according to a schedule. Your health care provider will recommend vaccines for you based on your age, medical history, and  lifestyle or other factors, such as travel or where you work.   What tests do I need? Blood tests  Lipid and cholesterol levels. These may be checked every 5 years, or more often if you are over 26 years old.  Hepatitis C test.  Hepatitis B test. Screening  Lung cancer screening. You may have this screening every year starting at age 105 if you have a 30-pack-year history of smoking and currently smoke or have quit within the past 15 years.  Colorectal cancer screening. ? All adults should have this screening starting at age 19 and continuing until age 58. ? Your health care provider may recommend screening at age 32 if you are at increased risk. ? You will have tests every 1-10 years, depending on your results and the type of screening test.  Diabetes screening. ? This is done by checking your blood sugar (glucose) after you have not eaten for a while (fasting). ? You may have this done every 1-3 years.  Mammogram. ? This may be done every 1-2 years. ? Talk with your health care provider about when you should start having regular mammograms. This may depend on whether you have a family history of breast cancer.  BRCA-related cancer screening. This may be done if you have a family history of breast, ovarian, tubal, or peritoneal cancers.  Pelvic exam and Pap test. ? This may be done every 3 years starting at age 61. ? Starting at age 48, this may be done every 5 years if you have a Pap test  in combination with an HPV test. Other tests  STD (sexually transmitted disease) testing, if you are at risk.  Bone density scan. This is done to screen for osteoporosis. You may have this scan if you are at high risk for osteoporosis. Talk with your health care provider about your test results, treatment options, and if necessary, the need for more tests. Follow these instructions at home: Eating and drinking  Eat a diet that includes fresh fruits and vegetables, whole grains, lean protein,  and low-fat dairy products.  Take vitamin and mineral supplements as recommended by your health care provider.  Do not drink alcohol if: ? Your health care provider tells you not to drink. ? You are pregnant, may be pregnant, or are planning to become pregnant.  If you drink alcohol: ? Limit how much you have to 0-1 drink a day. ? Be aware of how much alcohol is in your drink. In the [place] one drink equals one 12 oz bottle of beer (355 mL), one 5 oz glass of wine (148 mL), or one 1 oz glass of hard liquor (44 mL).   Lifestyle  Take daily care of your teeth and gums. Brush your teeth every morning and night with fluoride toothpaste. Floss one time each day.  Stay active. Exercise for at least 30 minutes 5 or more days each week.  Do not use any products that contain nicotine or tobacco, such as cigarettes, e-cigarettes, and chewing tobacco. If you need help quitting, ask your health care provider.  Do not use drugs.  If you are sexually active, practice safe sex. Use a condom or other form of protection to prevent STIs (sexually transmitted infections).  If you do not wish to become pregnant, use a form of birth control. If you plan to become pregnant, see your health care provider for a prepregnancy visit.  If told by your health care provider, take low-dose aspirin daily starting at age 58.  Find healthy ways to cope with stress, such as: ? Meditation, yoga, or listening to music. ? Journaling. ? Talking to a trusted person. ? Spending time with friends and family. Safety  Always wear your seat belt while driving or riding in a vehicle.  Do not drive: ? If you have been drinking alcohol. Do not ride with someone who has been drinking. ? When you are tired or distracted. ? While texting.  Wear a helmet and other protective equipment during sports activities.  If you have firearms in your house, make sure you follow all gun safety procedures. What's next?  Visit your  health care provider once a year for an annual wellness visit.  Ask your health care provider how often you should have your eyes and teeth checked.  Stay up to date on all vaccines. This information is not intended to replace advice given to you by your health care provider. Make sure you discuss any questions you have with your health care provider. Document Revised: 09/03/2020 Document Reviewed: 08/11/2018 Elsevier Patient Education  2021 [employer].

## 2021-04-24 NOTE — Progress Notes (Signed)
Patient: Jennifer Lang, Female    DOB: 1961/09/03, 60 y.o.   MRN: [id number] Danelle Berry, PA-C Visit Date: 04/24/2021  Today's Provider: Danelle Berry, PA-C   Chief Complaint  Patient presents with  . Annual Exam   Subjective:   Annual physical exam:  Jennifer Lang is a 60 y.o. female who presents today for complete physical exam:  Exercise/Activity: exercising about 1x a week  Diet/nutrition:  Overall healthy  Sleep:  No concerns   USPSTF grade A and B recommendations - reviewed and addressed today  Depression:  Phq 9 completed today by patient, was reviewed by me with patient in the room PHQ score is neg, pt feels mood is good PHQ 2/9 Scores 04/24/2021 04/01/2020 01/24/2020 09/25/2019  PHQ - 2 Score 0 0 0 0  PHQ- 9 Score 0 0 0 0   Depression screen [facility] 2/9 04/24/2021 04/01/2020 01/24/2020 09/25/2019 03/03/2019  Decreased Interest 0 0 0 0 0  Down, Depressed, Hopeless 0 0 0 0 0  PHQ - 2 Score 0 0 0 0 0  Altered sleeping 0 0 0 0 0  Tired, decreased energy 0 0 0 0 0  Change in appetite 0 0 0 0 0  Feeling bad or failure about yourself  0 0 0 0 0  Trouble concentrating 0 0 0 0 0  Moving slowly or fidgety/restless 0 0 0 0 0  Suicidal thoughts 0 0 0 0 0  PHQ-9 Score 0 0 0 0 0  Difficult doing work/chores Not difficult at all Not difficult at all Not difficult at all Not difficult at all Not difficult at all    Alcohol screening: Flowsheet Row Office Visit from 04/01/2020 in [facility]  AUDIT-C Score 0      Immunizations and Health Maintenance: Health Maintenance  Topic Date Due  . COVID-19 Vaccine (2 - Pfizer 3-dose series) 04/30/2020  . MAMMOGRAM  07/02/2021  . INFLUENZA VACCINE  07/14/2021  . COLONOSCOPY (Pts 45-56yrs Insurance coverage will need to be confirmed)  12/14/2022  . PAP SMEAR-Modifier  02/20/2023  . TETANUS/TDAP  10/15/2027  . Hepatitis C Screening  Completed  . HIV Screening  Completed  . HPV VACCINES  Aged Out     Hep C  Screening: done  STD testing and prevention (HIV/chl/gon/syphilis):  see above, no additional testing desired by pt today  Intimate partner violence:denies   Sexual History/Pain during Intercourse: Single  Menstrual History/LMP/Abnormal Bleeding: denies No LMP recorded. Patient is postmenopausal.  Incontinence Symptoms: seeing urogyn  Breast cancer: family hx, due in July Last Mammogram: *see HM list above   Cervical cancer screening: UTD- done with GYN  Osteoporosis:   Discussion on osteoporosis per age, including high calcium and vitamin D supplementation, weight bearing exercises Pt is supplementing with daily calcium/Vit D.  Skin cancer:  Hx of skin CA  - sees Dermatology regularly Discussed atypical lesions   Colorectal cancer:   Colonoscopy is UTD    Discussed concerning signs and sx of CRC, pt denies melena, hematochezia, change in bowels  Lung cancer:   Low Dose CT Chest recommended if Age 19-80 years, 30 pack-year currently smoking OR have quit w/in 15years. Patient does not qualify.    Social History   Tobacco Use  . Smoking status: Never Smoker  . Smokeless tobacco: Never Used  Vaping Use  . Vaping Use: Never used  Substance Use Topics  . Alcohol use: No  . Drug use: No     Flowsheet  Row Office Visit from 04/01/[facility] in [facility]  AUDIT-C Score 0      Family History  Problem Relation Age of Onset  . Arthritis Mother   . Alzheimer's disease Mother   . Hyperlipidemia Mother   . Breast cancer Mother        45's  . Hypertension Father   . Breast cancer Paternal Aunt        Passed away in 28's from Breast CA  . Hyperlipidemia Sister   . Bladder Cancer Brother   . Prostate cancer Brother   . Kidney cancer Brother   . Stroke Maternal Grandmother   . Uterine cancer Paternal Grandmother      Blood pressure/Hypertension: BP Readings from Last 3 Encounters:  04/24/21 112/70  07/20/20 121/73  04/23/20 135/80     Weight/Obesity: Wt Readings from Last 3 Encounters:  04/24/21 138 lb 6.4 oz (62.8 kg)  04/23/20 138 lb (62.6 kg)  04/16/20 136 lb (61.7 kg)   BMI Readings from Last 3 Encounters:  04/24/21 25.31 kg/m  04/23/20 25.24 kg/m  04/16/20 24.87 kg/m     Lipids:  Lab Results  Component Value Date   CHOL 235 (H) 03/03/2019   CHOL 215 (H) 03/01/2018   Lab Results  Component Value Date   HDL 58 03/03/2019   HDL 51 03/01/2018   Lab Results  Component Value Date   LDLCALC 158 (H) 03/03/2019   LDLCALC 141 (H) 03/01/2018   Lab Results  Component Value Date   TRIG 83 03/03/2019   TRIG 113 03/01/2018   Lab Results  Component Value Date   CHOLHDL 4.1 03/03/2019   CHOLHDL 4.2 03/01/2018   No results found for: LDLDIRECT Based on the results of lipid panel his/her cardiovascular risk factor ( using [place] )  in the next 10 years is: The 10-year ASCVD risk score Mikey Bussing DC Brooke Bonito., et al., 2013) is: 2.8%   Values used to calculate the score:     Age: 80 years     Sex: Female     Is Non-Hispanic African American: No     Diabetic: No     Tobacco smoker: No     Systolic Blood Pressure: 734 mmHg     Is BP treated: No     HDL Cholesterol: 58 mg/dL     Total Cholesterol: 235 mg/dL Glucose:  Glucose, Bld  Date Value Ref Range Status  03/01/2018 84 65 - 99 mg/dL Final    Comment:    .            Fasting reference interval .    Hypertension: BP Readings from Last 3 Encounters:  04/24/21 112/70  07/20/20 121/73  04/23/20 135/80   Obesity: Wt Readings from Last 3 Encounters:  04/24/21 138 lb 6.4 oz (62.8 kg)  04/23/20 138 lb (62.6 kg)  04/16/20 136 lb (61.7 kg)   BMI Readings from Last 3 Encounters:  04/24/21 25.31 kg/m  04/23/20 25.24 kg/m  04/16/20 24.87 kg/m    The 10-year ASCVD risk score Mikey Bussing DC Jr., et al., 2013) is: 2.8%   Values used to calculate the score:     Age: 77 years     Sex: Female     Is Non-Hispanic African American: No     Diabetic:  No     Tobacco smoker: No     Systolic Blood Pressure: 193 mmHg     Is BP treated: No     HDL Cholesterol: 58 mg/dL  Total Cholesterol: 235 mg/dL   Social History      She        Social History   Socioeconomic History  . Marital status: Single    Spouse name: Not on file  . Number of children: 3  . Years of education: Not on file  . Highest education level: Associate degree: academic program  Occupational History  . Not on file  Tobacco Use  . Smoking status: Never Smoker  . Smokeless tobacco: Never Used  Vaping Use  . Vaping Use: Never used  Substance and Sexual Activity  . Alcohol use: No  . Drug use: No  . Sexual activity: Not Currently    Partners: Male    Birth control/protection: Post-menopausal  Other Topics Concern  . Not on file  Social History Narrative   Lives with daughter and granddaughter.  Moved from [place] in August 2018. Works at [employer] at [employer] in [place].   Social Determinants of Health   Financial Resource Strain: Low Risk   . Difficulty of Paying Living Expenses: Not hard at all  Food Insecurity: No Food Insecurity  . Worried About Charity fundraiser in the Last Year: Never true  . Ran Out of Food in the Last Year: Never true  Transportation Needs: No Transportation Needs  . Lack of Transportation (Medical): No  . Lack of Transportation (Non-Medical): No  Physical Activity: Insufficiently Active  . Days of Exercise per Week: 1 day  . Minutes of Exercise per Session: 60 min  Stress: No Stress Concern Present  . Feeling of Stress : Only a little  Social Connections: Socially Isolated  . Frequency of Communication with Friends and Family: More than three times a week  . Frequency of Social Gatherings with Friends and Family: Once a week  . Attends Religious Services: Never  . Active Member of Clubs or Organizations: No  . Attends Archivist Meetings: Never  . Marital Status: Divorced    Family History         Family History  Problem Relation Age of Onset  . Arthritis Mother   . Alzheimer's disease Mother   . Hyperlipidemia Mother   . Breast cancer Mother        43's  . Hypertension Father   . Breast cancer Paternal Aunt        Passed away in 72's from Breast CA  . Hyperlipidemia Sister   . Bladder Cancer Brother   . Prostate cancer Brother   . Kidney cancer Brother   . Stroke Maternal Grandmother   . Uterine cancer Paternal Grandmother     Patient Active Problem List   Diagnosis Date Noted  . Atrophic vaginitis 04/24/2021  . Hyperlipidemia 02/01/2018    Past Surgical History:  Procedure Laterality Date  . CESAREAN SECTION    . TUBAL LIGATION       Current Outpatient Medications:  .  cetirizine (ZYRTEC) 10 MG tablet, Take 10 mg by mouth daily., Disp: , Rfl:  .  cholecalciferol (VITAMIN D) 400 units TABS tablet, Take 400 Units by mouth., Disp: , Rfl:  .  Multiple Vitamin (MULTIVITAMIN) tablet, Take 1 tablet by mouth daily., Disp: , Rfl:  .  psyllium (METAMUCIL) 58.6 % packet, Take 1 packet by mouth daily., Disp: , Rfl:   Allergies  Allergen Reactions  . Iodinated Diagnostic Agents Hives    Pt states she had a CT Scan and before the scan was completed she developed hives and was  given Benadryl within a few minutes of the scan.     Patient Care Team: Delsa Grana, PA-C as PCP - General (Family Medicine)  Review of Systems  Constitutional: Negative.  Negative for activity change, appetite change, fatigue and unexpected weight change.  HENT: Negative.   Eyes: Negative.   Respiratory: Negative.  Negative for shortness of breath.   Cardiovascular: Negative.  Negative for chest pain, palpitations and leg swelling.  Gastrointestinal: Negative.  Negative for abdominal pain and blood in stool.  Endocrine: Negative.   Genitourinary: Negative.   Musculoskeletal: Negative.  Negative for arthralgias, gait problem, joint swelling and myalgias.  Skin: Negative.  Negative for pallor  and rash.  Allergic/Immunologic: Negative.   Neurological: Negative.  Negative for syncope and weakness.  Hematological: Negative.   Psychiatric/Behavioral: Negative.  Negative for dysphoric mood, self-injury and suicidal ideas. The patient is not nervous/anxious.   All other systems reviewed and are negative.    I personally reviewed active problem list, medication list, allergies, family history, social history, health maintenance, notes from last encounter, lab results, imaging with the patient/caregiver today.       Objective:   Vitals:  Vitals:   04/24/21 1438  BP: 112/70  Pulse: 91  Resp: 16  Temp: 98.1 F (36.7 C)  SpO2: 98%  Weight: 138 lb 6.4 oz (62.8 kg)  Height: 5\' 2"  (1.575 m)    Body mass index is 25.31 kg/m.  Physical Exam Vitals and nursing note reviewed.  Constitutional:      General: She is not in acute distress.    Appearance: Normal appearance. She is well-developed. She is not ill-appearing, toxic-appearing or diaphoretic.     Interventions: Face mask in place.  HENT:     Head: Normocephalic and atraumatic.     Right Ear: External ear normal.     Left Ear: External ear normal.  Eyes:     General: Lids are normal. No scleral icterus.       Right eye: No discharge.        Left eye: No discharge.     Conjunctiva/sclera: Conjunctivae normal.  Neck:     Trachea: Phonation normal. No tracheal deviation.  Cardiovascular:     Rate and Rhythm: Normal rate and regular rhythm.     Pulses: Normal pulses.          Radial pulses are 2+ on the right side and 2+ on the left side.       Posterior tibial pulses are 2+ on the right side and 2+ on the left side.     Heart sounds: Normal heart sounds. No murmur heard. No friction rub. No gallop.   Pulmonary:     Effort: Pulmonary effort is normal. No respiratory distress.     Breath sounds: Normal breath sounds. No stridor. No wheezing, rhonchi or rales.  Chest:     Chest wall: No tenderness.  Abdominal:      General: Bowel sounds are normal. There is no distension.     Palpations: Abdomen is soft.  Musculoskeletal:     Right lower leg: No edema.     Left lower leg: No edema.  Skin:    General: Skin is warm and dry.     Coloration: Skin is not jaundiced or pale.     Findings: No rash.  Neurological:     Mental Status: She is alert.     Motor: No abnormal muscle tone.     Gait: Gait normal.  Psychiatric:  Mood and Affect: Mood normal.        Speech: Speech normal.        Behavior: Behavior normal.       Fall Risk: Fall Risk  04/24/2021 04/01/2020 01/24/2020 09/25/2019 03/03/2019  Falls in the past year? 0 0 0 0 0  Number falls in past yr: 0 0 0 0 0  Injury with Fall? 0 0 0 0 0  Follow up - - - Falls evaluation completed Falls evaluation completed    Functional Status Survey: Is the patient deaf or have difficulty hearing?: No Does the patient have difficulty seeing, even when wearing glasses/contacts?: No Does the patient have difficulty concentrating, remembering, or making decisions?: No Does the patient have difficulty walking or climbing stairs?: No Does the patient have difficulty dressing or bathing?: No Does the patient have difficulty doing errands alone such as visiting a doctor's office or shopping?: No   Assessment & Plan:    CPE completed today  . USPSTF grade A and B recommendations reviewed with patient; age-appropriate recommendations, preventive care, screening tests, etc discussed and encouraged; healthy living encouraged; see AVS for patient education given to patient  . Discussed importance of 150 minutes of physical activity weekly, AHA exercise recommendations given to pt in AVS/handout  . Discussed importance of healthy diet:  eating lean meats and proteins, avoiding trans fats and saturated fats, avoid simple sugars and excessive carbs in diet, eat 6 servings of fruit/vegetables daily and drink plenty of water and avoid sweet beverages.     . Recommended pt to do annual eye exam and routine dental exams/cleanings  . Depression, alcohol, fall screening completed as documented above and per flowsheets  . Reviewed Health Maintenance: Health Maintenance  Topic Date Due  . COVID-19 Vaccine (2 - Pfizer 3-dose series) 04/30/2020  . MAMMOGRAM  07/02/2021  . INFLUENZA VACCINE  07/14/2021  . COLONOSCOPY (Pts 45-77yrs Insurance coverage will need to be confirmed)  12/14/2022  . PAP SMEAR-Modifier  02/20/2023  . TETANUS/TDAP  10/15/2027  . Hepatitis C Screening  Completed  . HIV Screening  Completed  . HPV VACCINES  Aged Out    . Immunizations: Immunization History  Administered Date(s) Administered  . PFIZER(Purple Top)SARS-COV-2 Vaccination 04/09/2020      ICD-10-CM   1. Annual physical exam  Z00.00 COMPLETE METABOLIC PANEL WITH GFR    Lipid panel    CBC with Differential/Platelet  2. Encounter for screening mammogram for malignant neoplasm of breast  Z12.31 MM 3D SCREEN BREAST BILATERAL  3. Hyperlipidemia, unspecified hyperlipidemia type  E78.5 COMPLETE METABOLIC PANEL WITH GFR    Lipid panel   recheck, not on meds, recalculate ASCVD, advised healthy diet/lifestyle  4. Atrophic vaginitis  N95.2 estradiol (ESTRACE) 0.1 MG/GM vaginal cream   with urinary sx, per urogyn  5. Need for shingles vaccine  Z23 Zoster Vaccine Adjuvanted [facility]) injection        Danelle Berry, PA-C 04/24/21 2:55 PM  [facility]

## 2021-04-25 LAB — COMPLETE METABOLIC PANEL WITH GFR
AG Ratio: 1.9 (calc) (ref 1.0–2.5)
ALT: 13 U/L (ref 6–29)
AST: 21 U/L (ref 10–35)
Albumin: 4.3 g/dL (ref 3.6–5.1)
Alkaline phosphatase (APISO): 93 U/L (ref 37–153)
BUN: 17 mg/dL (ref 7–25)
CO2: 25 mmol/L (ref 20–32)
Calcium: 9.4 mg/dL (ref 8.6–10.4)
Chloride: 108 mmol/L (ref 98–110)
Creat: 0.85 mg/dL (ref 0.50–0.99)
GFR, Est African American: 86 mL/min/{1.73_m2} (ref >=60)
GFR, Est Non African American: 74 mL/min/{1.73_m2} (ref >=60)
Globulin: 2.3 g/dL (calc) (ref 1.9–3.7)
Glucose, Bld: 97 mg/dL (ref 65–99)
Potassium: 4.2 mmol/L (ref 3.5–5.3)
Sodium: 142 mmol/L (ref 135–146)
Total Bilirubin: 0.2 mg/dL (ref 0.2–1.2)
Total Protein: 6.6 g/dL (ref 6.1–8.1)

## 2021-04-25 LAB — CBC WITH DIFFERENTIAL/PLATELET
Absolute Monocytes: 558 cells/uL (ref 200–950)
Basophils Absolute: 62 cells/uL (ref 0–200)
Basophils Relative: 1 %
Eosinophils Absolute: 87 cells/uL (ref 15–500)
Eosinophils Relative: 1.4 %
HCT: 36.8 % (ref 35.0–45.0)
Hemoglobin: 12.3 g/dL (ref 11.7–15.5)
Lymphs Abs: 1848 cells/uL (ref 850–3900)
MCH: 29.9 pg (ref 27.0–33.0)
MCHC: 33.4 g/dL (ref 32.0–36.0)
MCV: 89.3 fL (ref 80.0–100.0)
MPV: 10.7 fL (ref 7.5–12.5)
Monocytes Relative: 9 %
Neutro Abs: 3646 cells/uL (ref 1500–7800)
Neutrophils Relative %: 58.8 %
Platelets: 269 10*3/uL (ref 140–400)
RBC: 4.12 10*6/uL (ref 3.80–5.10)
RDW: 13.4 % (ref 11.0–15.0)
Total Lymphocyte: 29.8 %
WBC: 6.2 10*3/uL (ref 3.8–10.8)

## 2021-04-25 LAB — LIPID PANEL
Cholesterol: 212 mg/dL — ABNORMAL HIGH (ref <200)
HDL: 52 mg/dL (ref >=50)
LDL Cholesterol (Calc): 124 mg/dL (calc) — ABNORMAL HIGH
Non-HDL Cholesterol (Calc): 160 mg/dL (calc) — ABNORMAL HIGH (ref <130)
Total CHOL/HDL Ratio: 4.1 (calc) (ref <5.0)
Triglycerides: 216 mg/dL — ABNORMAL HIGH (ref <150)

## 2021-06-04 ENCOUNTER — Ambulatory Visit (INDEPENDENT_AMBULATORY_CARE_PROVIDER_SITE_OTHER): Payer: 59 | Admitting: Dermatology

## 2021-06-04 ENCOUNTER — Encounter: Payer: Self-pay | Admitting: Dermatology

## 2021-06-04 ENCOUNTER — Other Ambulatory Visit: Payer: Self-pay

## 2021-06-04 DIAGNOSIS — L710 Perioral dermatitis: Secondary | ICD-10-CM

## 2021-06-04 MED ORDER — DOXYCYCLINE MONOHYDRATE 100 MG PO CAPS
100.0000 mg | ORAL_CAPSULE | Freq: Every day | ORAL | 2 refills | Status: DC
  Filled 2021-06-04: qty 30, 30d supply, fill #0

## 2021-06-04 MED ORDER — CLINDAMYCIN PHOSPHATE 1 % EX LOTN
TOPICAL_LOTION | Freq: Two times a day (BID) | CUTANEOUS | 2 refills | Status: DC
Start: 2021-06-04 — End: 2021-10-27
  Filled 2021-06-04: qty 60, 30d supply, fill #0

## 2021-06-04 NOTE — Patient Instructions (Addendum)
Doxycycline should be taken with food to prevent nausea. Do not lay down for 30 minutes after taking. Be cautious with sun exposure and use good sun protection while on this medication. Pregnant women should not take this medication.   Perioral dermatitis  Perioral dermatitis is an eruption which is usually located around the mouth and nose.  It can be a rash and/or red bumps.  It occasionally occurs around the eyes.  It may be itchy and may burn.  The exact cause is unknown.  Some types of makeup, moisturizers, dental products, and prescription creams may be partially responsible for the eruption.  Topical steroids such as cortisone creams can temporarily make the rash better but with discontinuation the rash tends to recur and worsen.  If you have been using topical steroids, your dermatologist may need to gradually taper the strength of steroids.  Topical antibiotics, elidel cream, protopic ointment, and oral antiobiotics may be prescribed to treat this condition.  Although perioral dermatitis is not an infection, some antibiotics have anti-inflammatory properties that help it greatly.   If you have any questions or concerns for your doctor, please call our main line at [phone] and press option 4 to reach your doctor's medical assistant. If no one answers, please leave a voicemail as directed and we will return your call as soon as possible. Messages left after 4 pm will be answered the following business day.   You may also send [place] a message via [place]. We typically respond to MyChart messages within 1-2 business days.  For prescription refills, please ask your pharmacy to contact our office. Our fax number is [phone].  If you have an urgent issue when the clinic is closed that cannot wait until the next business day, you can page your doctor at the number below.    Please note that while we do our best to be available for urgent issues outside of office hours, we are not available 24/7.    If you have an urgent issue and are unable to reach [place], you may choose to seek medical care at your doctor's office, retail clinic, urgent care center, or emergency room.  If you have a medical emergency, please immediately call 911 or go to the emergency department.  Pager Numbers  - Dr. Nehemiah Massed: [phone]  - Dr. Laurence Ferrari: [phone]  - Dr. Nicole Kindred: [phone]  In the event of inclement weather, please call our main line at [phone] for an update on the status of any delays or closures.  Dermatology Medication Tips: Please keep the boxes that topical medications come in in order to help keep track of the instructions about where and how to use these. Pharmacies typically print the medication instructions only on the boxes and not directly on the medication tubes.   If your medication is too expensive, please contact our office at [phone] option 4 or send [place] a message through [place].   We are unable to tell what your co-pay for medications will be in advance as this is different depending on your insurance coverage. However, we may be able to find a substitute medication at lower cost or fill out paperwork to get insurance to cover a needed medication.   If a prior authorization is required to get your medication covered by your insurance company, please allow [place] 1-2 business days to complete this process.  Drug prices often vary depending on where the prescription is filled and some pharmacies may offer cheaper prices.  The website www.goodrx.com contains  coupons for medications through different pharmacies. The prices here do not account for what the cost may be with help from insurance (it may be cheaper with your insurance), but the website can give you the price if you did not use any insurance.  - You can print the associated coupon and take it with your prescription to the pharmacy.  - You may also stop by our office during regular business hours and pick up a  GoodRx coupon card.  - If you need your prescription sent electronically to a different pharmacy, notify our office through [facility] or by phone at [phone] option 4.

## 2021-06-04 NOTE — Progress Notes (Signed)
   Follow-Up Visit   Subjective  Jennifer Lang is a 60 y.o. female who presents for the following: Rash (Dur: 3 weeks. Left upper lip/nose area. Red, itching. Used OTC anti-itch topicals, no help. ).    The following portions of the chart were reviewed this encounter and updated as appropriate:       Review of Systems: No other skin or systemic complaints except as noted in HPI or Assessment and Plan.   Objective  Well appearing patient in no apparent distress; mood and affect are within normal limits.  A focused examination was performed including head, including the scalp, face, neck, nose, ears, eyelids, and lips. Relevant physical exam findings are noted in the Assessment and Plan.  Left inferior nasal alar crease Small bright pink papules within erythematous patch, no scale  Assessment & Plan  Perioral dermatitis Left inferior nasal alar crease  May take 1-2 mos to completely clear  start Doxycycline 100mg  1 po qd with food x 1-2 mos, #30 1rf  Doxycycline should be taken with food to prevent nausea. Do not lay down for 30 minutes after taking. Be cautious with sun exposure and use good sun protection while on this medication. Pregnant women should not take this medication.    Clindamycin lotion BID to aa  D/C OTC HC cream. Topical steroid use can make condition worse.  Perioral dermatitis is an eruption which is usually located around the mouth and nose.  It can be a rash and/or red bumps.  It occasionally occurs around the eyes.  It may be itchy and may burn.  The exact cause is unknown.  Some types of makeup, moisturizers, dental products, and prescription creams may be partially responsible for the eruption.  Topical steroids such as cortisone creams can temporarily make the rash better but with discontinuation the rash tends to recur and worsen, so they should be avoided. Topical antibiotics, elidel cream, protopic ointment, and oral antibiotics may be prescribed to  treat this condition.  Although perioral dermatitis is not an infection, some antibiotics have anti-inflammatory properties that help it greatly.     doxycycline (MONODOX) 100 MG capsule - Left inferior nasal alar crease Take 1 capsule (100 mg total) by mouth daily. Take with food  clindamycin (CLEOCIN-T) 1 % lotion - Left inferior nasal alar crease Apply topically 2 (two) times daily.  Return in about 1 month (around 07/04/2021).  I, Emelia Salisbury, CMA, am acting as scribe for Brendolyn Patty, MD.  Documentation: I have reviewed the above documentation for accuracy and completeness, and I agree with the above.  Brendolyn Patty MD

## 2021-07-03 ENCOUNTER — Ambulatory Visit
Admission: RE | Admit: 2021-07-03 | Discharge: 2021-07-03 | Disposition: A | Discharge status: Home / Self Care | Payer: BC Managed Care – PPO | Source: Ambulatory Visit | Attending: Family Medicine | Admitting: Family Medicine

## 2021-07-03 ENCOUNTER — Other Ambulatory Visit: Payer: Self-pay

## 2021-07-03 DIAGNOSIS — Z1231 Encounter for screening mammogram for malignant neoplasm of breast: Secondary | ICD-10-CM | POA: Insufficient documentation

## 2021-07-07 ENCOUNTER — Ambulatory Visit: Payer: 59 | Admitting: Dermatology

## 2021-08-17 DIAGNOSIS — J0100 Acute maxillary sinusitis, unspecified: Secondary | ICD-10-CM | POA: Diagnosis not present

## 2021-10-17 ENCOUNTER — Ambulatory Visit: Payer: Self-pay

## 2021-10-17 NOTE — Telephone Encounter (Signed)
We have no openings please advise pt

## 2021-10-17 NOTE — Telephone Encounter (Signed)
Patient called in to inform provider that she found a deer tick attached to the back of her neck in her hairline. She tried removing this tick but the head is still attached. Wanting to know if she need antibiotics but no major complaints. Please advise and call Ph# [phone]   Pt. Reports she pulled a deer tick off this morning - on her neck at the hairline. Asking for an antibiotic for coverage. Declines office visit. Please advise pt.   Answer Assessment - Initial Assessment Questions 1. TYPE of TICK: "Is it a wood tick or a deer tick?" (e.g., deer tick, wood tick; unsure)     Deer 2. SIZE of TICK: "How big is the tick?" (e.g., size of poppy seed, apple seed, watermelon seed; unsure) Note: Deer ticks can be the size of a poppy seed (nymph) or an apple seed (adult).       Small 3. ENGORGED: "Did the tick look flat or engorged (full, swollen)?" (e.g., flat, engorged; unsure)     No 4. LOCATION: "Where is the tick bite located?"      Back of neck 5. ONSET: "How long do you think the tick was attached before you removed it?" (e.g., 5 hours, 2 days)      Unsure 6. APPEARANCE of BITE or RASH: "What does the site look like?"     Red 7. PREGNANCY: "Is there any chance you are pregnant?" "When was your last menstrual period?"     No  Protocols used: Tick Bite-A-AH

## 2021-10-17 NOTE — Telephone Encounter (Signed)
Lvm to inform pt

## 2021-10-27 ENCOUNTER — Encounter: Payer: Self-pay | Admitting: Nurse Practitioner

## 2021-10-27 ENCOUNTER — Other Ambulatory Visit: Payer: Self-pay

## 2021-10-27 ENCOUNTER — Ambulatory Visit: Payer: BC Managed Care – PPO | Admitting: Nurse Practitioner

## 2021-10-27 VITALS — BP 118/72 | HR 92 | Temp 97.9°F | Resp 18 | Ht 62.0 in | Wt 125.8 lb

## 2021-10-27 DIAGNOSIS — A6004 Herpesviral vulvovaginitis: Secondary | ICD-10-CM

## 2021-10-27 MED ORDER — VALACYCLOVIR HCL 1 G PO TABS
500.0000 mg | ORAL_TABLET | Freq: Every day | ORAL | 5 refills | Status: DC
Start: 2021-10-27 — End: 2021-10-27
  Filled 2021-10-27: qty 30, 60d supply, fill #0

## 2021-10-27 MED ORDER — VALACYCLOVIR HCL 1 G PO TABS
500.0000 mg | ORAL_TABLET | Freq: Two times a day (BID) | ORAL | 5 refills | Status: DC
Start: 2021-10-27 — End: 2021-10-27
  Filled 2021-10-27: qty 60, 60d supply, fill #0

## 2021-10-27 MED ORDER — VALACYCLOVIR HCL 500 MG PO TABS
500.0000 mg | ORAL_TABLET | Freq: Every day | ORAL | 5 refills | Status: DC
Start: 2021-10-27 — End: 2022-08-19
  Filled 2021-10-27: qty 30, 30d supply, fill #0
  Filled 2021-11-24: qty 30, 30d supply, fill #1
  Filled 2022-01-05: qty 30, 30d supply, fill #2
  Filled 2022-03-04: qty 30, 30d supply, fill #3
  Filled 2022-05-30: qty 30, 30d supply, fill #4
  Filled 2022-07-15: qty 30, 30d supply, fill #5

## 2021-10-27 NOTE — Progress Notes (Signed)
BP 118/72   Pulse 92   Temp 97.9 F (36.6 C) (Oral)   Resp 18   Ht 5\' 2"  (1.575 m)   Wt 125 lb 12.8 oz (57.1 kg)   SpO2 98%   BMI 23.01 kg/m    Subjective:    Patient ID: Jennifer Lang, female    DOB: 1961/02/27, 60 y.o.   MRN: [id number]  HPI: Jennifer Lang is a 60 y.o. female, here alone  Chief Complaint  Patient presents with   Consult    Would like to discuss medication for STD   Herpes: She says she has not had an outbreak for a few months. She says she does not currently have an outbreak. She says that she was married for 26 years and her husband gave her herpes.  She says she is now divorced and is ready to start dating.  She would like to be on prophylaxis to prevent outbreaks.  Discussed using protection.  Discussed what to do if she does have an outbreak.    Relevant past medical, surgical, family and social history reviewed and updated as indicated. Interim medical history since our last visit reviewed. Allergies and medications reviewed and updated.  Review of Systems  Constitutional: Negative for fever or weight change.  Respiratory: Negative for cough and shortness of breath.   Cardiovascular: Negative for chest pain or palpitations.  Gastrointestinal: Negative for abdominal pain, no bowel changes.  Musculoskeletal: Negative for gait problem or joint swelling.  Skin: Negative for rash.  Neurological: Negative for dizziness or headache.  No other specific complaints in a complete review of systems (except as listed in HPI above).      Objective:    BP 118/72   Pulse 92   Temp 97.9 F (36.6 C) (Oral)   Resp 18   Ht 5\' 2"  (1.575 m)   Wt 125 lb 12.8 oz (57.1 kg)   SpO2 98%   BMI 23.01 kg/m   Wt Readings from Last 3 Encounters:  10/27/21 125 lb 12.8 oz (57.1 kg)  04/24/21 138 lb 6.4 oz (62.8 kg)  04/23/20 138 lb (62.6 kg)    Physical Exam  Constitutional: Patient appears well-developed and well-nourished. No distress.  HEENT: head atraumatic,  normocephalic, pupils equal and reactive to light, neck supple, throat within normal limits Cardiovascular: Normal rate, regular rhythm and normal heart sounds.  No murmur heard. No BLE edema. Pulmonary/Chest: Effort normal and breath sounds normal. No respiratory distress. Abdominal: Soft.  There is no tenderness. Psychiatric: Patient has a normal mood and affect. behavior is normal. Judgment and thought content normal.   Results for orders placed or performed in visit on 04/24/21  COMPLETE METABOLIC PANEL WITH GFR  Result Value Ref Range   Glucose, Bld 97 65 - 99 mg/dL   BUN 17 7 - 25 mg/dL   Creat 0.85 0.50 - 0.99 mg/dL   GFR, Est Non African American 74 > OR = 60 mL/min/1.69m2   GFR, Est African American 86 > OR = 60 mL/min/1.30m2   BUN/Creatinine Ratio NOT APPLICABLE 6 - 22 (calc)   Sodium 142 135 - 146 mmol/L   Potassium 4.2 3.5 - 5.3 mmol/L   Chloride 108 98 - 110 mmol/L   CO2 25 20 - 32 mmol/L   Calcium 9.4 8.6 - 10.4 mg/dL   Total Protein 6.6 6.1 - 8.1 g/dL   Albumin 4.3 3.6 - 5.1 g/dL   Globulin 2.3 1.9 - 3.7 g/dL (calc)   AG Ratio 1.9  1.0 - 2.5 (calc)   Total Bilirubin 0.2 0.2 - 1.2 mg/dL   Alkaline phosphatase (APISO) 93 37 - 153 U/L   AST 21 10 - 35 U/L   ALT 13 6 - 29 U/L  Lipid panel  Result Value Ref Range   Cholesterol 212 (H) <200 mg/dL   HDL 52 > OR = 50 mg/dL   Triglycerides 216 (H) <150 mg/dL   LDL Cholesterol (Calc) 124 (H) mg/dL (calc)   Total CHOL/HDL Ratio 4.1 <5.0 (calc)   Non-HDL Cholesterol (Calc) 160 (H) <130 mg/dL (calc)  CBC with Differential/Platelet  Result Value Ref Range   WBC 6.2 3.8 - 10.8 Thousand/uL   RBC 4.12 3.80 - 5.10 Million/uL   Hemoglobin 12.3 11.7 - 15.5 g/dL   HCT 36.8 35.0 - 45.0 %   MCV 89.3 80.0 - 100.0 fL   MCH 29.9 27.0 - 33.0 pg   MCHC 33.4 32.0 - 36.0 g/dL   RDW 13.4 11.0 - 15.0 %   Platelets 269 140 - 400 Thousand/uL   MPV 10.7 7.5 - 12.5 fL   Neutro Abs 3,646 1,500 - 7,800 cells/uL   Lymphs Abs 1,848 850 - 3,900  cells/uL   Absolute Monocytes 558 200 - 950 cells/uL   Eosinophils Absolute 87 15 - 500 cells/uL   Basophils Absolute 62 0 - 200 cells/uL   Neutrophils Relative % 58.8 %   Total Lymphocyte 29.8 %   Monocytes Relative 9.0 %   Eosinophils Relative 1.4 %   Basophils Relative 1.0 %      Assessment & Plan:   1. Herpes simplex vulvovaginitis -use protection when having sexual intercourse - valACYclovir (VALTREX) 500 MG tablet; Take 1 tablet (500 mg total) by mouth daily.  Dispense: 30 tablet; Refill: 5   Follow up plan: Return in about 6 months (around 04/26/2022) for follow up.

## 2021-10-28 ENCOUNTER — Ambulatory Visit: Payer: BC Managed Care – PPO | Attending: Internal Medicine

## 2021-10-28 ENCOUNTER — Other Ambulatory Visit: Payer: Self-pay

## 2021-10-28 DIAGNOSIS — Z23 Encounter for immunization: Secondary | ICD-10-CM

## 2021-10-28 MED ORDER — MODERNA COVID-19 BIVAL BOOSTER 50 MCG/0.5ML IM SUSP
INTRAMUSCULAR | 0 refills | Status: DC
  Filled 2021-10-28: qty 0.5, 1d supply, fill #0

## 2021-10-28 NOTE — Progress Notes (Signed)
   Covid-19 Vaccination Clinic  Name:  Jennifer Lang    MRN: [id number] DOB: 25-Jun-1961  10/28/2021  [name] was observed post Covid-19 immunization for 15 minutes without incident. She was provided with Vaccine Information Sheet and instruction to access the V-Safe system.   [name] was instructed to call 911 with any severe reactions post vaccine: Difficulty breathing  Swelling of face and throat  A fast heartbeat  A bad rash all over body  Dizziness and weakness   Immunizations Administered     Name Date Dose VIS Date Route   Moderna Covid-19 vaccine Bivalent Booster 10/28/2021  2:46 PM 0.5 mL 07/26/2021 Intramuscular   Manufacturer: Levan Hurst   Lot: [id number]   NDC: 05259-102-89      Lu Duffel, PharmD, MBA Clinical Acute Care Pharmacist

## 2021-11-16 IMAGING — MG MM DIGITAL SCREENING BILAT W/ TOMO AND CAD
8 series · 9 of 24 positions shown · non-contrast
Comparison: Previous exam(s).

CLINICAL DATA: Screening.

EXAM:
DIGITAL SCREENING BILATERAL MAMMOGRAM WITH TOMOSYNTHESIS AND CAD
TECHNIQUE: Bilateral screening digital craniocaudal and mediolateral oblique
mammograms were obtained. Bilateral screening digital breast
tomosynthesis was performed. The images were evaluated with
computer-aided detection.

[L MLO synth-2D]
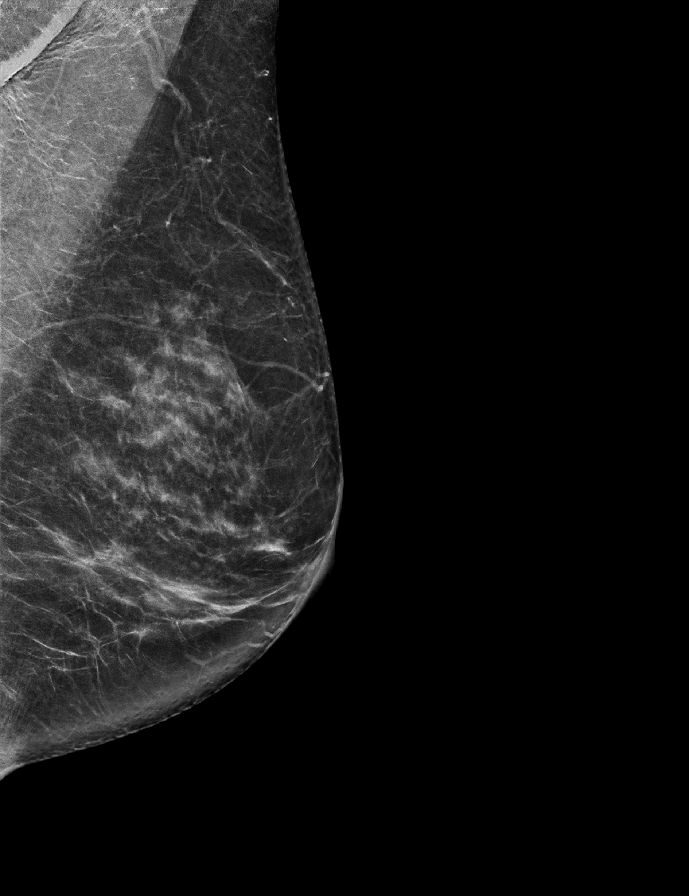

[R MLO synth-2D]
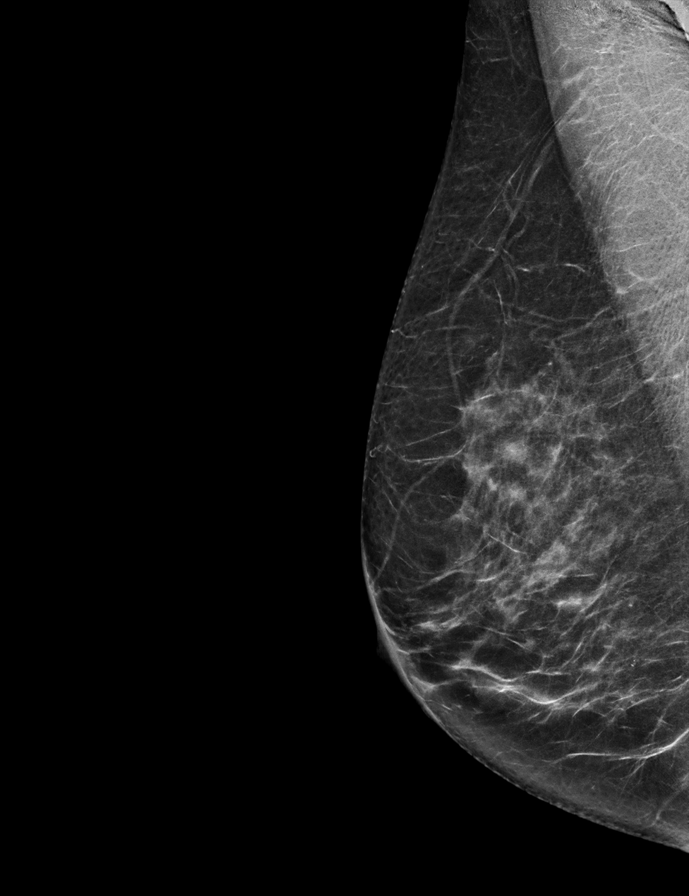

[L CC synth-2D]
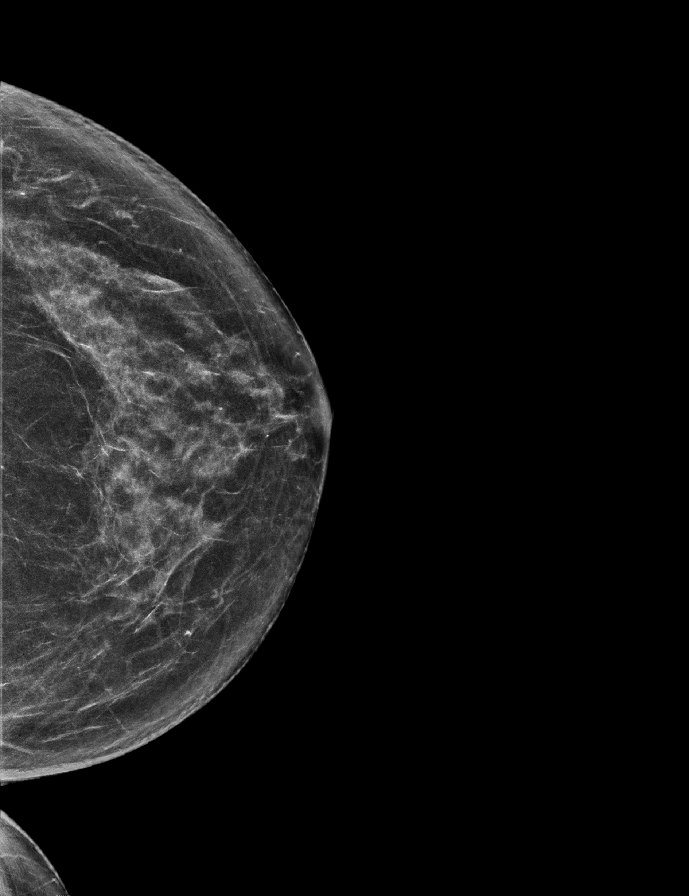

[R CC synth-2D]
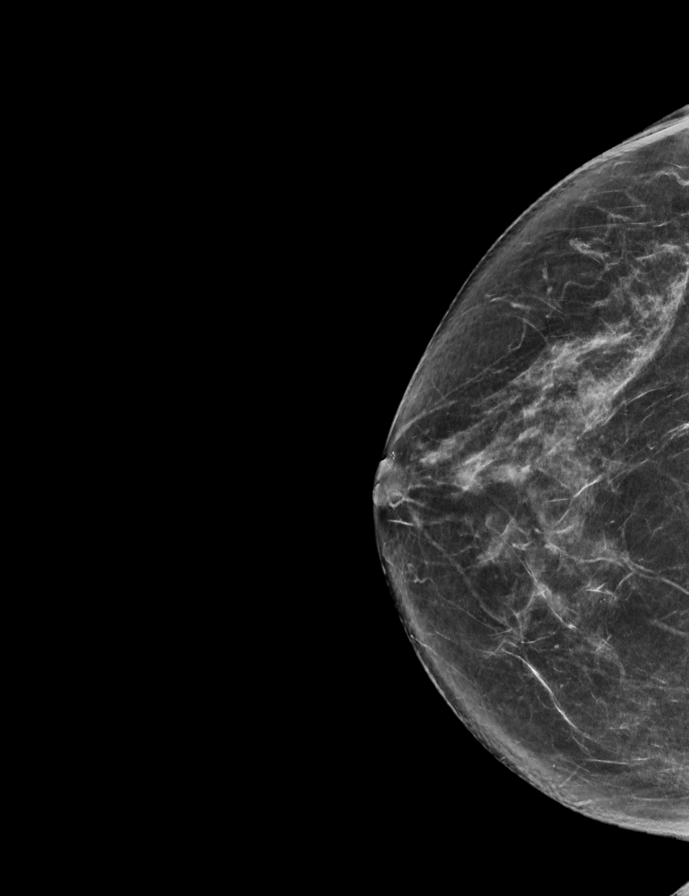

[L CC tomo · 2 of 64 frames shown]
[frame 21/64]
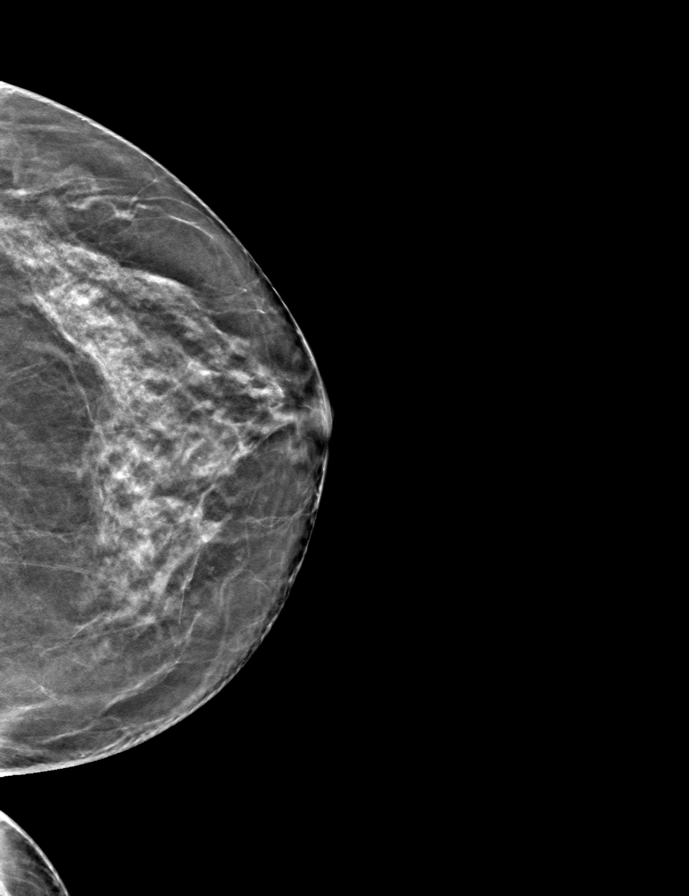
[frame 33/64]
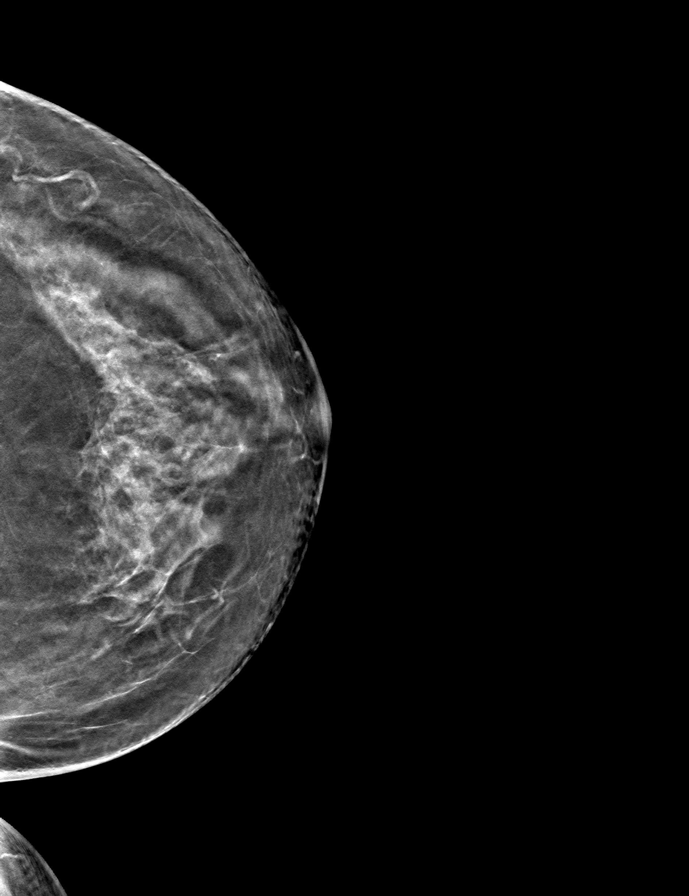

[R MLO tomo · tomo slice 33/65.0]
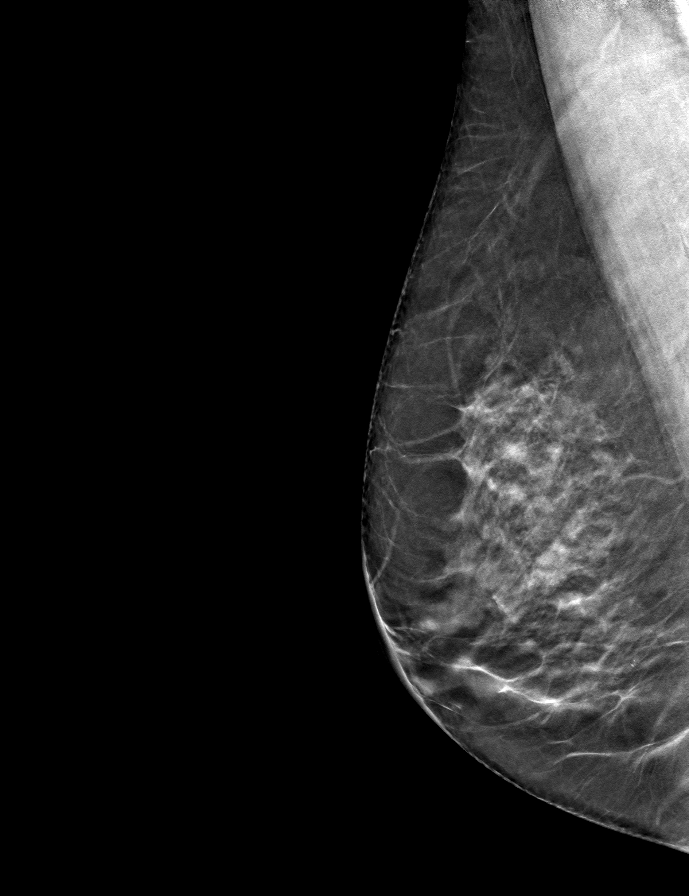

[L MLO tomo · tomo slice 35/68.0]
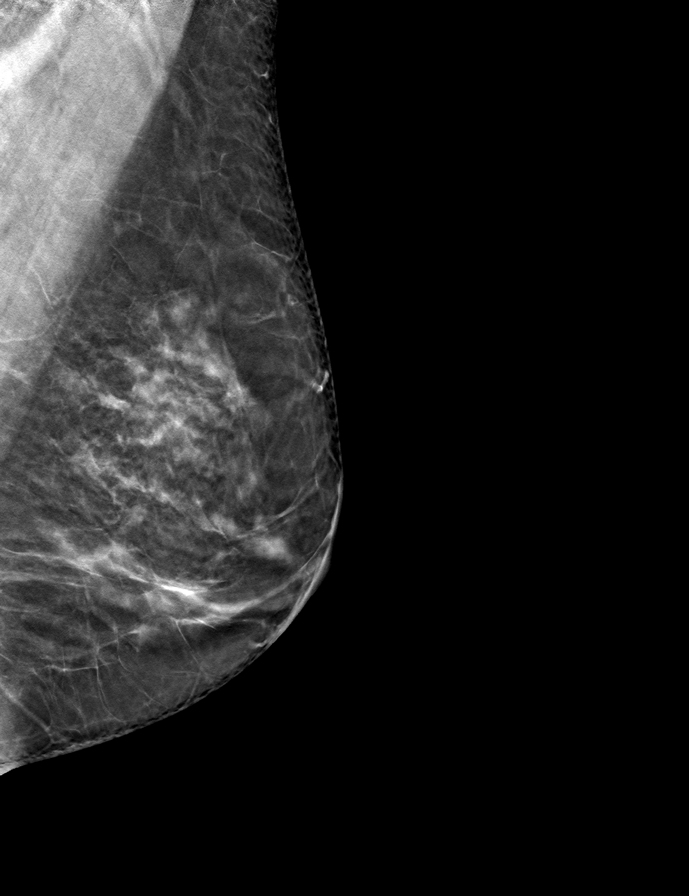

[R CC tomo · tomo slice 37/72.0]
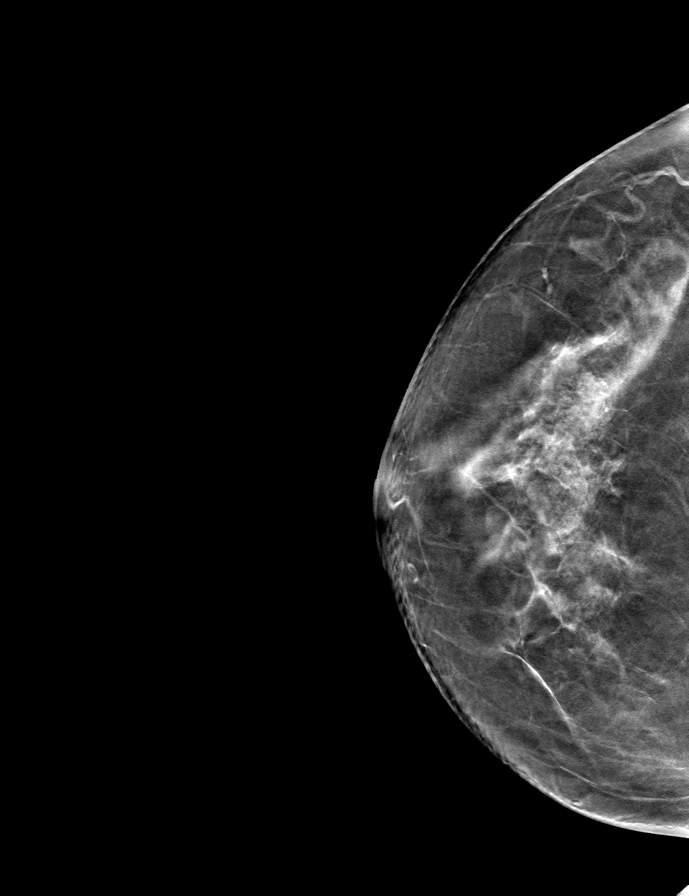

[9 of 24 positions shown; findings below may reference images not displayed]

ACR Breast Density Category c: The breast tissue is heterogeneously
dense, which may obscure small masses.
FINDINGS: There are no findings suspicious for malignancy.
IMPRESSION: No mammographic evidence of malignancy. A result letter of this
screening mammogram will be mailed directly to the patient.

RECOMMENDATION:
Screening mammogram in one year. (Code:Q3-[id number])

BI-RADS CATEGORY  1: Negative.

## 2021-11-24 ENCOUNTER — Other Ambulatory Visit: Payer: Self-pay

## 2022-01-06 ENCOUNTER — Other Ambulatory Visit: Payer: Self-pay

## 2022-01-14 ENCOUNTER — Other Ambulatory Visit: Payer: Self-pay

## 2022-01-14 ENCOUNTER — Ambulatory Visit: Payer: BC Managed Care – PPO | Admitting: Nurse Practitioner

## 2022-01-14 ENCOUNTER — Encounter: Payer: Self-pay | Admitting: Nurse Practitioner

## 2022-01-14 VITALS — BP 126/80 | HR 91 | Temp 98.2°F | Resp 16 | Ht 62.0 in | Wt 128.8 lb

## 2022-01-14 DIAGNOSIS — Z1231 Encounter for screening mammogram for malignant neoplasm of breast: Secondary | ICD-10-CM | POA: Diagnosis not present

## 2022-01-14 DIAGNOSIS — Z0000 Encounter for general adult medical examination without abnormal findings: Secondary | ICD-10-CM

## 2022-01-14 DIAGNOSIS — E785 Hyperlipidemia, unspecified: Secondary | ICD-10-CM | POA: Diagnosis not present

## 2022-01-14 DIAGNOSIS — Z130 Encounter for screening for diseases of the blood and blood-forming organs and certain disorders involving the immune mechanism: Secondary | ICD-10-CM

## 2022-01-14 DIAGNOSIS — Z131 Encounter for screening for diabetes mellitus: Secondary | ICD-10-CM | POA: Diagnosis not present

## 2022-01-14 LAB — CBC WITH DIFFERENTIAL/PLATELET
Absolute Monocytes: 530 cells/uL (ref 200–950)
Basophils Absolute: 41 cells/uL (ref 0–200)
Basophils Relative: 0.6 %
Eosinophils Absolute: 102 cells/uL (ref 15–500)
Eosinophils Relative: 1.5 %
HCT: 38.3 % (ref 35.0–45.0)
Hemoglobin: 12.8 g/dL (ref 11.7–15.5)
Lymphs Abs: 2536 cells/uL (ref 850–3900)
MCH: 29.6 pg (ref 27.0–33.0)
MCHC: 33.4 g/dL (ref 32.0–36.0)
MCV: 88.5 fL (ref 80.0–100.0)
MPV: 9.8 fL (ref 7.5–12.5)
Monocytes Relative: 7.8 %
Neutro Abs: 3590 cells/uL (ref 1500–7800)
Neutrophils Relative %: 52.8 %
Platelets: 282 10*3/uL (ref 140–400)
RBC: 4.33 10*6/uL (ref 3.80–5.10)
RDW: 13.3 % (ref 11.0–15.0)
Total Lymphocyte: 37.3 %
WBC: 6.8 10*3/uL (ref 3.8–10.8)

## 2022-01-14 LAB — LIPID PANEL
Cholesterol: 212 mg/dL — ABNORMAL HIGH (ref <200)
HDL: 48 mg/dL — ABNORMAL LOW (ref >=50)
LDL Cholesterol (Calc): 128 mg/dL (calc) — ABNORMAL HIGH
Non-HDL Cholesterol (Calc): 164 mg/dL (calc) — ABNORMAL HIGH (ref <130)
Total CHOL/HDL Ratio: 4.4 (calc) (ref <5.0)
Triglycerides: 221 mg/dL — ABNORMAL HIGH (ref <150)

## 2022-01-14 LAB — COMPLETE METABOLIC PANEL WITH GFR
AG Ratio: 2 (calc) (ref 1.0–2.5)
ALT: 13 U/L (ref 6–29)
AST: 22 U/L (ref 10–35)
Albumin: 4.5 g/dL (ref 3.6–5.1)
Alkaline phosphatase (APISO): 97 U/L (ref 37–153)
BUN: 16 mg/dL (ref 7–25)
CO2: 30 mmol/L (ref 20–32)
Calcium: 10 mg/dL (ref 8.6–10.4)
Chloride: 104 mmol/L (ref 98–110)
Creat: 0.67 mg/dL (ref 0.50–1.05)
Globulin: 2.2 g/dL (calc) (ref 1.9–3.7)
Glucose, Bld: 109 mg/dL — ABNORMAL HIGH (ref 65–99)
Potassium: 4.1 mmol/L (ref 3.5–5.3)
Sodium: 142 mmol/L (ref 135–146)
Total Bilirubin: 0.2 mg/dL (ref 0.2–1.2)
Total Protein: 6.7 g/dL (ref 6.1–8.1)
eGFR: 99 mL/min/{1.73_m2} (ref >=60)

## 2022-01-14 NOTE — Progress Notes (Signed)
Name: Jennifer Lang   MRN: [id number]    DOB: October 24, 1961   Date:01/14/2022       Progress Note  Subjective  Chief Complaint  Chief Complaint  Patient presents with   Annual Exam    HPI  Patient presents for annual CPE.  Diet: Well balanced diet, including dairy Exercise: hiking, yard work. Discussed recommendations of 150 min a week of physical activity.  [place] Office Visit from 01/14/2022 in [facility]  AUDIT-C Score 0      Depression: Phq 9 is  negative Depression screen [facility] 2/9 01/14/2022 10/27/2021 04/24/2021 04/01/2020 01/24/2020  Decreased Interest 0 0 0 0 0  Down, Depressed, Hopeless 0 0 0 0 0  PHQ - 2 Score 0 0 0 0 0  Altered sleeping 0 - 0 0 0  Tired, decreased energy 0 - 0 0 0  Change in appetite 0 - 0 0 0  Feeling bad or failure about yourself  0 - 0 0 0  Trouble concentrating 0 - 0 0 0  Moving slowly or fidgety/restless 0 - 0 0 0  Suicidal thoughts 0 - 0 0 0  PHQ-9 Score 0 - 0 0 0  Difficult doing work/chores Not difficult at all - Not difficult at all Not difficult at all Not difficult at all   Hypertension: BP Readings from Last 3 Encounters:  01/14/22 126/80  10/27/21 118/72  04/24/21 112/70   Obesity: Wt Readings from Last 3 Encounters:  01/14/22 128 lb 12.8 oz (58.4 kg)  10/27/21 125 lb 12.8 oz (57.1 kg)  04/24/21 138 lb 6.4 oz (62.8 kg)   BMI Readings from Last 3 Encounters:  01/14/22 23.56 kg/m  10/27/21 23.01 kg/m  04/24/21 25.31 kg/m     Vaccines:  HPV: up to at age 62 , ask insurance if age between 90-45  Shingrix: 62-64 yo and ask insurance if covered when patient above 9 yo Pneumonia:  educated and discussed with patient. Flu:  educated and discussed with patient.  Hep C Screening: 03/01/2018 STD testing and prevention (HIV/chl/gon/syphilis): 03/01/2018 Intimate partner violence:none Sexual History : sexual active with one partner Menstrual History/LMP/Abnormal Bleeding: postmenopausal Incontinence  Symptoms: none  Breast cancer:  - Last Mammogram: 07/03/2021, due in July - BRCA gene screening: none  Osteoporosis: Discussed high calcium and vitamin D supplementation, weight bearing exercises  Cervical cancer screening: 02/20/2020, due next year  Skin cancer: Discussed monitoring for atypical lesions  Colorectal cancer: 12/14/2012, due next year Lung cancer:   Low Dose CT Chest recommended if Age 45-80 years, 20 pack-year currently smoking OR have quit w/in 15years. Patient does not qualify.   ECG: none  Advanced Care Planning: A voluntary discussion about advance care planning including the explanation and discussion of advance directives.  Discussed health care proxy and Living will, and the patient was able to identify a health care proxy as sister, Vinnie Level.  Patient does not have a living will at present time. If patient does have living will, I have requested they bring this to the clinic to be scanned in to their chart.  Lipids: Lab Results  Component Value Date   CHOL 212 (H) 04/24/2021   CHOL 235 (H) 03/03/2019   CHOL 215 (H) 03/01/2018   Lab Results  Component Value Date   HDL 52 04/24/2021   HDL 58 03/03/2019   HDL 51 03/01/2018   Lab Results  Component Value Date   LDLCALC 124 (H) 04/24/2021   LDLCALC 158 (H) 03/03/2019  LDLCALC 141 (H) 03/01/2018   Lab Results  Component Value Date   TRIG 216 (H) 04/24/2021   TRIG 83 03/03/2019   TRIG 113 03/01/2018   Lab Results  Component Value Date   CHOLHDL 4.1 04/24/2021   CHOLHDL 4.1 03/03/2019   CHOLHDL 4.2 03/01/2018   No results found for: LDLDIRECT  Glucose: Glucose, Bld  Date Value Ref Range Status  04/24/2021 97 65 - 99 mg/dL Final    Comment:    .            Fasting reference interval .   03/01/2018 84 65 - 99 mg/dL Final    Comment:    .            Fasting reference interval .     Patient Active Problem List   Diagnosis Date Noted   Atrophic vaginitis 04/24/2021   Hyperlipidemia  02/01/2018    Past Surgical History:  Procedure Laterality Date   CESAREAN SECTION     TUBAL LIGATION      Family History  Problem Relation Age of Onset   Arthritis Mother    Alzheimer's disease Mother    Hyperlipidemia Mother    Breast cancer Mother        35's   Hypertension Father    Breast cancer Paternal Aunt        Passed away in 75's from Breast CA   Hyperlipidemia Sister    Bladder Cancer Brother    Prostate cancer Brother    Kidney cancer Brother    Stroke Maternal Grandmother    Uterine cancer Paternal Grandmother     Social History   Socioeconomic History   Marital status: Single    Spouse name: Not on file   Number of children: 3   Years of education: Not on file   Highest education level: Associate degree: academic program  Occupational History   Not on file  Tobacco Use   Smoking status: Never   Smokeless tobacco: Never  Vaping Use   Vaping Use: Never used  Substance and Sexual Activity   Alcohol use: No   Drug use: No   Sexual activity: Not Currently    Partners: Male    Birth control/protection: Post-menopausal  Other Topics Concern   Not on file  Social History Narrative   Lives with daughter and granddaughter.  Moved from [place] in August 2018. Works at [employer] at [employer] in [place].   Social Determinants of Health   Financial Resource Strain: Low Risk    Difficulty of Paying Living Expenses: Not hard at all  Food Insecurity: No Food Insecurity   Worried About Charity fundraiser in the Last Year: Never true   [place] in the Last Year: Never true  Transportation Needs: No Transportation Needs   Lack of Transportation (Medical): No   Lack of Transportation (Non-Medical): No  Physical Activity: Insufficiently Active   Days of Exercise per Week: 2 days   Minutes of Exercise per Session: 30 min  Stress: No Stress Concern Present   Feeling of Stress : Not at all  Social Connections: Moderately Integrated   Frequency  of Communication with Friends and Family: More than three times a week   Frequency of Social Gatherings with Friends and Family: More than three times a week   Attends Religious Services: More than 4 times per year   Active Member of [employer] or Organizations: Yes   Attends Archivist Meetings: More than  4 times per year   Marital Status: Divorced  Human resources officer Violence: Not At Risk   Fear of Current or Ex-Partner: No   Emotionally Abused: No   Physically Abused: No   Sexually Abused: No   Discussed physical activity recommendations Social isolation discussed she does not attend church services but she does work five days a week with people.  She is not isolated.   Current Outpatient Medications:    cetirizine (ZYRTEC) 10 MG tablet, Take 10 mg by mouth daily., Disp: , Rfl:    cholecalciferol (VITAMIN D) 400 units TABS tablet, Take 400 Units by mouth., Disp: , Rfl:    COVID-19 mRNA bivalent vaccine, Moderna, (MODERNA COVID-19 BIVAL BOOSTER) 50 MCG/0.5ML injection, Inject into the muscle., Disp: 0.5 mL, Rfl: 0   estradiol (ESTRACE) 0.1 MG/GM vaginal cream, Place 1 Applicatorful vaginally 2 (two) times a week., Disp: 42.5 g, Rfl: 3   Multiple Vitamin (MULTIVITAMIN) tablet, Take 1 tablet by mouth daily., Disp: , Rfl:    psyllium (METAMUCIL) 58.6 % packet, Take 1 packet by mouth daily., Disp: , Rfl:    valACYclovir (VALTREX) 500 MG tablet, Take 1 tablet (500 mg total) by mouth daily., Disp: 30 tablet, Rfl: 5  Allergies  Allergen Reactions   Iodinated Contrast Media Hives    Pt states she had a CT Scan and before the scan was completed she developed hives and was given Benadryl within a few minutes of the scan.      ROS  Constitutional: Negative for fever or weight change.  Respiratory: Negative for cough and shortness of breath.   Cardiovascular: Negative for chest pain or palpitations.  Gastrointestinal: Negative for abdominal pain, no bowel changes.  Musculoskeletal:  Negative for gait problem or joint swelling.  Skin: Negative for rash.  Neurological: Negative for dizziness or headache.  No other specific complaints in a complete review of systems (except as listed in HPI above).   Objective  Vitals:   01/14/22 1417  BP: 126/80  Pulse: 91  Resp: 16  Temp: 98.2 F (36.8 C)  TempSrc: Oral  SpO2: 99%  Weight: 128 lb 12.8 oz (58.4 kg)  Height: _0  (1.575 m)    Body mass index is 23.56 kg/m.  Physical Exam  Constitutional: Patient appears well-developed and well-nourished. No distress.  HENT: Head: Normocephalic and atraumatic. Ears: B TMs ok, no erythema or effusion; Nose: Nose normal. Mouth/Throat: not done Eyes: Conjunctivae and EOM are normal. Pupils are equal, round, and reactive to light. No scleral icterus.  Neck: Normal range of motion. Neck supple. No JVD present. No thyromegaly present.  Cardiovascular: Normal rate, regular rhythm and normal heart sounds.  No murmur heard. No BLE edema. Pulmonary/Chest: Effort normal and breath sounds normal. No respiratory distress. Abdominal: Soft. Bowel sounds are normal, no distension. There is no tenderness. no masses Breast: no lumps or masses, no nipple discharge or rashes FEMALE GENITALIA: not done RECTAL: not done Musculoskeletal: Normal range of motion, no joint effusions. No gross deformities Neurological: he is alert and oriented to person, place, and time. No cranial nerve deficit. Coordination, balance, strength, speech and gait are normal.  Skin: Skin is warm and dry. No rash noted. No erythema.  Psychiatric: Patient has a normal mood and affect. behavior is normal. Judgment and thought content normal.     Fall Risk: Fall Risk  01/14/2022 10/27/2021 04/24/2021 04/01/2020 01/24/2020  Falls in the past year? 0 0 0 0 0  Number falls in past yr: 0 0  0 0 0  Injury with Fall? 0 0 0 0 0  Follow up Falls evaluation completed Falls evaluation completed - - -     Functional Status  Survey: Is the patient deaf or have difficulty hearing?: No Does the patient have difficulty seeing, even when wearing glasses/contacts?: No Does the patient have difficulty concentrating, remembering, or making decisions?: No Does the patient have difficulty walking or climbing stairs?: No Does the patient have difficulty dressing or bathing?: No Does the patient have difficulty doing errands alone such as visiting a doctor's office or shopping?: No   Assessment & Plan  1. Annual physical exam - Lipid panel - CBC with Differential/Platelet - COMPLETE METABOLIC PANEL WITH GFR - MM 3D SCREEN BREAST BILATERAL; Future  2. Hyperlipidemia, unspecified hyperlipidemia type  - Lipid panel - COMPLETE METABOLIC PANEL WITH GFR  3. Screening for deficiency anemia  - CBC with Differential/Platelet  4. Encounter for screening mammogram for malignant neoplasm of breast  - MM 3D SCREEN BREAST BILATERAL; Future  5. Screening for diabetes mellitus  - COMPLETE METABOLIC PANEL WITH GFR   -USPSTF grade A and B recommendations reviewed with patient; age-appropriate recommendations, preventive care, screening tests, etc discussed and encouraged; healthy living encouraged; see AVS for patient education given to patient -Discussed importance of 150 minutes of physical activity weekly, eat two servings of fish weekly, eat one serving of tree nuts ( cashews, pistachios, pecans, almonds.Marland Kitchen) every other day, eat 6 servings of fruit/vegetables daily and drink plenty of water and avoid sweet beverages.

## 2022-03-04 ENCOUNTER — Other Ambulatory Visit: Payer: Self-pay

## 2022-04-29 DIAGNOSIS — D485 Neoplasm of uncertain behavior of skin: Secondary | ICD-10-CM | POA: Diagnosis not present

## 2022-04-29 DIAGNOSIS — C44311 Basal cell carcinoma of skin of nose: Secondary | ICD-10-CM | POA: Diagnosis not present

## 2022-05-31 ENCOUNTER — Other Ambulatory Visit: Payer: Self-pay

## 2022-06-01 ENCOUNTER — Other Ambulatory Visit: Payer: Self-pay

## 2022-07-03 DIAGNOSIS — C44311 Basal cell carcinoma of skin of nose: Secondary | ICD-10-CM | POA: Diagnosis not present

## 2022-07-06 ENCOUNTER — Ambulatory Visit
Admission: RE | Admit: 2022-07-06 | Discharge: 2022-07-06 | Disposition: A | Discharge status: Home / Self Care | Payer: BC Managed Care – PPO | Source: Ambulatory Visit | Attending: Nurse Practitioner | Admitting: Nurse Practitioner

## 2022-07-06 DIAGNOSIS — Z1231 Encounter for screening mammogram for malignant neoplasm of breast: Secondary | ICD-10-CM | POA: Diagnosis not present

## 2022-07-06 DIAGNOSIS — Z0000 Encounter for general adult medical examination without abnormal findings: Secondary | ICD-10-CM | POA: Insufficient documentation

## 2022-07-15 ENCOUNTER — Other Ambulatory Visit: Payer: Self-pay

## 2022-08-15 ENCOUNTER — Other Ambulatory Visit: Payer: Self-pay | Admitting: Nurse Practitioner

## 2022-08-15 DIAGNOSIS — A6004 Herpesviral vulvovaginitis: Secondary | ICD-10-CM

## 2022-08-18 ENCOUNTER — Other Ambulatory Visit: Payer: Self-pay | Admitting: Nurse Practitioner

## 2022-08-18 ENCOUNTER — Other Ambulatory Visit: Payer: Self-pay

## 2022-08-18 DIAGNOSIS — A6004 Herpesviral vulvovaginitis: Secondary | ICD-10-CM

## 2022-08-19 ENCOUNTER — Other Ambulatory Visit: Payer: Self-pay

## 2022-08-19 MED FILL — Valacyclovir HCl Tab 500 MG: ORAL | 90 days supply | Qty: 90 | Fill #0 | Status: AC

## 2022-08-19 NOTE — Telephone Encounter (Signed)
Requested Prescriptions  Pending Prescriptions Disp Refills  . valACYclovir (VALTREX) 500 MG tablet 90 tablet 0    Sig: Take 1 tablet (500 mg total) by mouth daily.     Antimicrobials:  Antiviral Agents - Anti-Herpetic Passed - 08/18/2022  9:32 AM      Passed - Valid encounter within last 12 months    Recent Outpatient Visits          7 months ago Annual physical exam   [facility] Bo Merino, FNP   9 months ago Herpes simplex vulvovaginitis   [facility] Bo Merino, FNP   1 year ago Annual physical exam   [facility] Delsa Grana, PA-C   2 years ago Pain in both lower extremities   [facility] Delsa Grana, PA-C   2 years ago Burning with urination   [facility] Delsa Grana, [place]

## 2022-08-20 ENCOUNTER — Other Ambulatory Visit: Payer: Self-pay

## 2022-09-04 ENCOUNTER — Other Ambulatory Visit: Payer: Self-pay

## 2022-09-04 DIAGNOSIS — H1011 Acute atopic conjunctivitis, right eye: Secondary | ICD-10-CM | POA: Diagnosis not present

## 2022-09-04 MED ORDER — OFLOXACIN 0.3 % OP SOLN
2.0000 [drp] | Freq: Four times a day (QID) | OPHTHALMIC | 0 refills | Status: DC
  Filled 2022-09-04: qty 5, 13d supply, fill #0

## 2022-10-12 ENCOUNTER — Encounter (INDEPENDENT_AMBULATORY_CARE_PROVIDER_SITE_OTHER): Payer: Self-pay

## 2022-10-19 ENCOUNTER — Other Ambulatory Visit: Payer: Self-pay

## 2022-10-19 DIAGNOSIS — J0100 Acute maxillary sinusitis, unspecified: Secondary | ICD-10-CM | POA: Diagnosis not present

## 2022-10-19 MED ORDER — AMOXICILLIN-POT CLAVULANATE 875-125 MG PO TABS
ORAL_TABLET | ORAL | 0 refills | Status: DC
  Filled 2022-10-19: qty 14, 7d supply, fill #0

## 2022-10-26 ENCOUNTER — Other Ambulatory Visit: Payer: Self-pay

## 2022-10-26 ENCOUNTER — Encounter: Payer: Self-pay | Admitting: Nurse Practitioner

## 2022-10-26 ENCOUNTER — Ambulatory Visit: Payer: BC Managed Care – PPO | Admitting: Nurse Practitioner

## 2022-10-26 VITALS — BP 122/74 | HR 92 | Temp 97.8°F | Resp 16 | Ht 62.0 in | Wt 121.9 lb

## 2022-10-26 DIAGNOSIS — J0140 Acute pansinusitis, unspecified: Secondary | ICD-10-CM | POA: Diagnosis not present

## 2022-10-26 MED ORDER — PREDNISONE 10 MG PO TABS
ORAL_TABLET | ORAL | 0 refills | Status: DC
  Filled 2022-10-26: qty 21, 6d supply, fill #0

## 2022-10-26 MED ORDER — AMOXICILLIN-POT CLAVULANATE 875-125 MG PO TABS
ORAL_TABLET | ORAL | 0 refills | Status: AC
  Filled 2022-10-26: qty 6, 3d supply, fill #0

## 2022-10-26 NOTE — Progress Notes (Signed)
BP 122/74   Pulse 92   Temp 97.8 F (36.6 C) (Oral)   Resp 16   Ht 5' 2" (1.575 m)   Wt 121 lb 14.4 oz (55.3 kg)   SpO2 99%   BMI 22.30 kg/m    Subjective:    Patient ID: Jennifer Lang, female    DOB: [birth year], 61 y.o.   MRN: [id number]  HPI: Jennifer Lang is a 61 y.o. female  Chief Complaint  Patient presents with   Sinusitis    Headache,, facial pressure for 6 weeks. Spoke to [employer] and got antibiotic   Sinusitis:  patient reports she has had symptoms for six weeks. She says she currently takes zyrtec and flonase. She says she has also done the netti pot.  She says she wakes up in the morning with a sinus headache. She says she saw a telemedicine provider and was given an antibiotic on 10/19/2022.  She says she completed the antibiotic yesterday. She says she is still not better.  She says she had a provider shove a qtip up her nose in September.  She says she has never had a sinus infection for so long.  Discussed taking antibiotic for three more days to equal 10 day treatment.  Will also add prednisone taper.  If no improvement will refer to ENT.   Relevant past medical, surgical, family and social history reviewed and updated as indicated. Interim medical history since our last visit reviewed. Allergies and medications reviewed and updated.  Review of Systems  Constitutional: Negative for fever or weight change.  HEENT: positive for nasal congestion Respiratory: Negative for cough and shortness of breath.   Cardiovascular: Negative for chest pain or palpitations.  Gastrointestinal: Negative for abdominal pain, no bowel changes.  Musculoskeletal: Negative for gait problem or joint swelling.  Skin: Negative for rash.  Neurological: Negative for dizziness, positive for headache.  No other specific complaints in a complete review of systems (except as listed in HPI above).      Objective:    BP 122/74   Pulse 92   Temp 97.8 F (36.6 C) (Oral)   Resp 16   Ht 5' 2"  (1.575 m)   Wt 121 lb 14.4 oz (55.3 kg)   SpO2 99%   BMI 22.30 kg/m   Wt Readings from Last 3 Encounters:  10/26/22 121 lb 14.4 oz (55.3 kg)  01/14/22 128 lb 12.8 oz (58.4 kg)  10/27/21 125 lb 12.8 oz (57.1 kg)    Physical Exam  Constitutional: Patient appears well-developed and well-nourished.  No distress.  HEENT: head atraumatic, normocephalic, pupils equal and reactive to light, ears TMs clear, neck supple, throat within normal limits Cardiovascular: Normal rate, regular rhythm and normal heart sounds.  No murmur heard. No BLE edema. Pulmonary/Chest: Effort normal and breath sounds normal. No respiratory distress. Abdominal: Soft.  There is no tenderness. Psychiatric: Patient has a normal mood and affect. behavior is normal. Judgment and thought content normal.  Results for orders placed or performed in visit on 01/14/22  Lipid panel  Result Value Ref Range   Cholesterol 212 (H) <200 mg/dL   HDL 48 (L) > OR = 50 mg/dL   Triglycerides 221 (H) <150 mg/dL   LDL Cholesterol (Calc) 128 (H) mg/dL (calc)   Total CHOL/HDL Ratio 4.4 <5.0 (calc)   Non-HDL Cholesterol (Calc) 164 (H) <130 mg/dL (calc)  CBC with Differential/Platelet  Result Value Ref Range   WBC 6.8 3.8 - 10.8 Thousand/uL   RBC 4.33  3.80 - 5.10 Million/uL   Hemoglobin 12.8 11.7 - 15.5 g/dL   HCT 38.3 35.0 - 45.0 %   MCV 88.5 80.0 - 100.0 fL   MCH 29.6 27.0 - 33.0 pg   MCHC 33.4 32.0 - 36.0 g/dL   RDW 13.3 11.0 - 15.0 %   Platelets 282 140 - 400 Thousand/uL   MPV 9.8 7.5 - 12.5 fL   Neutro Abs 3,590 1,500 - 7,800 cells/uL   Lymphs Abs 2,536 850 - 3,900 cells/uL   Absolute Monocytes 530 200 - 950 cells/uL   Eosinophils Absolute 102 15 - 500 cells/uL   Basophils Absolute 41 0 - 200 cells/uL   Neutrophils Relative % 52.8 %   Total Lymphocyte 37.3 %   Monocytes Relative 7.8 %   Eosinophils Relative 1.5 %   Basophils Relative 0.6 %  COMPLETE METABOLIC PANEL WITH GFR  Result Value Ref Range   Glucose, Bld 109 (H)  65 - 99 mg/dL   BUN 16 7 - 25 mg/dL   Creat 0.67 0.50 - 1.05 mg/dL   eGFR 99 > OR = 60 mL/min/1.23m   BUN/Creatinine Ratio NOT APPLICABLE 6 - 22 (calc)   Sodium 142 135 - 146 mmol/L   Potassium 4.1 3.5 - 5.3 mmol/L   Chloride 104 98 - 110 mmol/L   CO2 30 20 - 32 mmol/L   Calcium 10.0 8.6 - 10.4 mg/dL   Total Protein 6.7 6.1 - 8.1 g/dL   Albumin 4.5 3.6 - 5.1 g/dL   Globulin 2.2 1.9 - 3.7 g/dL (calc)   AG Ratio 2.0 1.0 - 2.5 (calc)   Total Bilirubin 0.2 0.2 - 1.2 mg/dL   Alkaline phosphatase (APISO) 97 37 - 153 U/L   AST 22 10 - 35 U/L   ALT 13 6 - 29 U/L      Assessment & Plan:   Problem List Items Addressed This Visit   None Visit Diagnoses     Acute non-recurrent pansinusitis    -  Primary   Continue zyrtec and flonase,extend augmentin three more days for a total of 10 days treatment, start steroid taper, if no improvement will refer to ENT.   Relevant Medications   amoxicillin-clavulanate (AUGMENTIN) 875-125 MG tablet   predniSONE (STERAPRED UNI-PAK 21 TAB) 10 MG (21) TBPK tablet        Follow up plan: Return if symptoms worsen or fail to improve.

## 2022-10-27 ENCOUNTER — Ambulatory Visit: Payer: BC Managed Care – PPO | Admitting: Nurse Practitioner

## 2022-10-29 ENCOUNTER — Encounter: Payer: Self-pay | Admitting: Nurse Practitioner

## 2022-10-29 ENCOUNTER — Other Ambulatory Visit: Payer: Self-pay | Admitting: Nurse Practitioner

## 2022-10-29 DIAGNOSIS — J0141 Acute recurrent pansinusitis: Secondary | ICD-10-CM

## 2022-11-19 ENCOUNTER — Other Ambulatory Visit: Payer: Self-pay

## 2022-11-19 DIAGNOSIS — J329 Chronic sinusitis, unspecified: Secondary | ICD-10-CM | POA: Diagnosis not present

## 2022-11-19 DIAGNOSIS — J309 Allergic rhinitis, unspecified: Secondary | ICD-10-CM | POA: Diagnosis not present

## 2022-11-19 DIAGNOSIS — R0981 Nasal congestion: Secondary | ICD-10-CM | POA: Diagnosis not present

## 2022-11-19 MED ORDER — FLUTICASONE PROPIONATE 50 MCG/ACT NA SUSP
2.0000 | Freq: Every day | NASAL | 11 refills | Status: DC
  Filled 2022-11-19: qty 16, 30d supply, fill #0
  Filled 2023-01-06: qty 16, 30d supply, fill #1
  Filled 2023-03-11: qty 16, 30d supply, fill #2
  Filled 2023-04-25: qty 16, 30d supply, fill #3
  Filled 2023-06-03: qty 16, 30d supply, fill #4
  Filled 2023-08-10: qty 16, 30d supply, fill #5
  Filled 2023-10-09: qty 16, 30d supply, fill #6
  Filled 2023-11-05: qty 16, 30d supply, fill #7

## 2022-12-28 DIAGNOSIS — R0981 Nasal congestion: Secondary | ICD-10-CM | POA: Diagnosis not present

## 2022-12-28 DIAGNOSIS — J329 Chronic sinusitis, unspecified: Secondary | ICD-10-CM | POA: Diagnosis not present

## 2022-12-28 DIAGNOSIS — J309 Allergic rhinitis, unspecified: Secondary | ICD-10-CM | POA: Diagnosis not present

## 2023-01-06 ENCOUNTER — Other Ambulatory Visit: Payer: Self-pay

## 2023-01-06 ENCOUNTER — Encounter: Payer: Self-pay | Admitting: Nurse Practitioner

## 2023-01-06 ENCOUNTER — Ambulatory Visit: Payer: BC Managed Care – PPO | Admitting: Nurse Practitioner

## 2023-01-06 ENCOUNTER — Other Ambulatory Visit: Payer: Self-pay | Admitting: Nurse Practitioner

## 2023-01-06 VITALS — BP 124/76 | HR 94 | Temp 98.2°F | Resp 18 | Ht 62.0 in | Wt 122.9 lb

## 2023-01-06 DIAGNOSIS — R591 Generalized enlarged lymph nodes: Secondary | ICD-10-CM

## 2023-01-06 DIAGNOSIS — A6004 Herpesviral vulvovaginitis: Secondary | ICD-10-CM

## 2023-01-06 MED ORDER — VALACYCLOVIR HCL 500 MG PO TABS
500.0000 mg | ORAL_TABLET | Freq: Every day | ORAL | 0 refills | Status: DC
  Filled 2023-01-06: qty 90, 90d supply, fill #0

## 2023-01-06 NOTE — Progress Notes (Signed)
BP 124/76   Pulse 94   Temp 98.2 F (36.8 C) (Oral)   Resp 18   Ht '5\' 2"'$  (1.575 m)   Wt 122 lb 14.4 oz (55.7 kg)   SpO2 99%   BMI 22.48 kg/m    Subjective:    Patient ID: Jennifer Lang, female    DOB: 19-Jan-1961, 62 y.o.   MRN: [id number]  HPI: Jennifer Lang is a 62 y.o. female  Chief Complaint  Patient presents with   Cyst    Behind left ear and felt in neck   Lymphadenopathy:  patient reports she noticed a lump under her ear by her jaw yesterday.  She says it is gone today. She denies any recent illness other than her usual sinus issues.  She denies any ear pain, sore throat or fever.  Upon inspection and palpation no lump noted. Reassurance provided to patient.   Relevant past medical, surgical, family and social history reviewed and updated as indicated. Interim medical history since our last visit reviewed. Allergies and medications reviewed and updated.  Review of Systems  Constitutional: Negative for fever or weight change.  HEENT: lump under left ear close to jaw Respiratory: Negative for cough and shortness of breath.   Cardiovascular: Negative for chest pain or palpitations.  Gastrointestinal: Negative for abdominal pain, no bowel changes.  Musculoskeletal: Negative for gait problem or joint swelling.  Skin: Negative for rash.  Neurological: Negative for dizziness or headache.  No other specific complaints in a complete review of systems (except as listed in HPI above).      Objective:    BP 124/76   Pulse 94   Temp 98.2 F (36.8 C) (Oral)   Resp 18   Ht '5\' 2"'$  (1.575 m)   Wt 122 lb 14.4 oz (55.7 kg)   SpO2 99%   BMI 22.48 kg/m   Wt Readings from Last 3 Encounters:  01/06/23 122 lb 14.4 oz (55.7 kg)  10/26/22 121 lb 14.4 oz (55.3 kg)  01/14/22 128 lb 12.8 oz (58.4 kg)    Physical Exam  Constitutional: Patient appears well-developed and well-nourished.  No distress.  HEENT: head atraumatic, normocephalic, pupils equal and reactive to light, ears  TMs CLEAR, neck supple, throat within normal limits, no lymphadenopathy  Cardiovascular: Normal rate, regular rhythm and normal heart sounds.  No murmur heard. No BLE edema. Pulmonary/Chest: Effort normal and breath sounds normal. No respiratory distress. Abdominal: Soft.  There is no tenderness. Psychiatric: Patient has a normal mood and affect. behavior is normal. Judgment and thought content normal.  Results for orders placed or performed in visit on 01/14/22  Lipid panel  Result Value Ref Range   Cholesterol 212 (H) <200 mg/dL   HDL 48 (L) > OR = 50 mg/dL   Triglycerides 221 (H) <150 mg/dL   LDL Cholesterol (Calc) 128 (H) mg/dL (calc)   Total CHOL/HDL Ratio 4.4 <5.0 (calc)   Non-HDL Cholesterol (Calc) 164 (H) <130 mg/dL (calc)  CBC with Differential/Platelet  Result Value Ref Range   WBC 6.8 3.8 - 10.8 Thousand/uL   RBC 4.33 3.80 - 5.10 Million/uL   Hemoglobin 12.8 11.7 - 15.5 g/dL   HCT 38.3 35.0 - 45.0 %   MCV 88.5 80.0 - 100.0 fL   MCH 29.6 27.0 - 33.0 pg   MCHC 33.4 32.0 - 36.0 g/dL   RDW 13.3 11.0 - 15.0 %   Platelets 282 140 - 400 Thousand/uL   MPV 9.8 7.5 - 12.5 fL  Neutro Abs 3,590 1,500 - 7,800 cells/uL   Lymphs Abs 2,536 850 - 3,900 cells/uL   Absolute Monocytes 530 200 - 950 cells/uL   Eosinophils Absolute 102 15 - 500 cells/uL   Basophils Absolute 41 0 - 200 cells/uL   Neutrophils Relative % 52.8 %   Total Lymphocyte 37.3 %   Monocytes Relative 7.8 %   Eosinophils Relative 1.5 %   Basophils Relative 0.6 %  COMPLETE METABOLIC PANEL WITH GFR  Result Value Ref Range   Glucose, Bld 109 (H) 65 - 99 mg/dL   BUN 16 7 - 25 mg/dL   Creat 0.67 0.50 - 1.05 mg/dL   eGFR 99 > OR = 60 mL/min/1.1m   BUN/Creatinine Ratio NOT APPLICABLE 6 - 22 (calc)   Sodium 142 135 - 146 mmol/L   Potassium 4.1 3.5 - 5.3 mmol/L   Chloride 104 98 - 110 mmol/L   CO2 30 20 - 32 mmol/L   Calcium 10.0 8.6 - 10.4 mg/dL   Total Protein 6.7 6.1 - 8.1 g/dL   Albumin 4.5 3.6 - 5.1 g/dL    Globulin 2.2 1.9 - 3.7 g/dL (calc)   AG Ratio 2.0 1.0 - 2.5 (calc)   Total Bilirubin 0.2 0.2 - 1.2 mg/dL   Alkaline phosphatase (APISO) 97 37 - 153 U/L   AST 22 10 - 35 U/L   ALT 13 6 - 29 U/L      Assessment & Plan:   Problem List Items Addressed This Visit   None Visit Diagnoses     Lymphadenopathy    -  Primary   no current lymphadenopathy, reassurance provided to patient        Follow up plan: Return if symptoms worsen or fail to improve.

## 2023-01-06 NOTE — Telephone Encounter (Signed)
Requested Prescriptions  Pending Prescriptions Disp Refills   valACYclovir (VALTREX) 500 MG tablet 90 tablet 0    Sig: Take 1 tablet (500 mg total) by mouth daily.     Antimicrobials:  Antiviral Agents - Anti-Herpetic Passed - 01/06/2023  5:38 AM      Passed - Valid encounter within last 12 months    Recent Outpatient Visits           Today Lymphadenopathy   [place], FNP   2 months ago Acute non-recurrent pansinusitis   [facility] Bo Merino, FNP   11 months ago Annual physical exam   [facility] Bo Merino, FNP   1 year ago Herpes simplex vulvovaginitis   [facility] Bo Merino, FNP   1 year ago Annual physical exam   [facility] Delsa Grana, [place]

## 2023-03-15 NOTE — Progress Notes (Unsigned)
Name: Jennifer Lang   MRN: [id number]    DOB: 12-09-1961   Date:03/16/2023       Progress Note  Subjective  Chief Complaint  Chief Complaint  Patient presents with   Annual Exam    HPI  Patient presents for annual CPE.  Diet: well balanced Exercise: yard work, hiking  Sleep: 8-9 hours a night Last dental exam:last week Last eye exam: last year  [place] Visit from 03/16/2023 in [facility]  AUDIT-C Score 0      Depression: Phq 9 is  negative    03/16/2023    1:40 PM 01/06/2023    8:47 AM 10/26/2022    9:37 AM 01/14/2022    2:22 PM 10/27/2021    2:43 PM  Depression screen PHQ 2/9  Decreased Interest 0 0 0 0 0  Down, Depressed, Hopeless 0 0 0 0 0  PHQ - 2 Score 0 0 0 0 0  Altered sleeping    0   Tired, decreased energy    0   Change in appetite    0   Feeling bad or failure about yourself     0   Trouble concentrating    0   Moving slowly or fidgety/restless    0   Suicidal thoughts    0   PHQ-9 Score    0   Difficult doing work/chores    Not difficult at all    Hypertension: BP Readings from Last 3 Encounters:  03/16/23 120/72  01/06/23 124/76  10/26/22 122/74   Obesity: Wt Readings from Last 3 Encounters:  03/16/23 125 lb 3.2 oz (56.8 kg)  01/06/23 122 lb 14.4 oz (55.7 kg)  10/26/22 121 lb 14.4 oz (55.3 kg)   BMI Readings from Last 3 Encounters:  03/16/23 22.90 kg/m  01/06/23 22.48 kg/m  10/26/22 22.30 kg/m     Vaccines:  HPV: up to at age 10 , ask insurance if age between 31-45  Shingrix: 63-64 yo and ask insurance if covered when patient above 29 yo Pneumonia:  educated and discussed with patient. Flu:  educated and discussed with patient.  Hep C Screening: 03/01/2018 STD testing and prevention (HIV/chl/gon/syphilis): 03/01/2018 Intimate partner violence:none Sexual History : yes Menstrual History/LMP/Abnormal Bleeding: postmenopausal  Incontinence Symptoms: none  Breast cancer:  - Last Mammogram:  07/06/2022 - BRCA gene screening: none  Osteoporosis: Discussed high calcium and vitamin D supplementation, weight bearing exercises  Cervical cancer screening: 02/20/2020, due  Skin cancer: Discussed monitoring for atypical lesions  Colorectal cancer: 12/14/2012,   Lung cancer:   Low Dose CT Chest recommended if Age 62-80 years, 20 pack-year currently smoking OR have quit w/in 15years. Patient does not qualify.   ECG: none  Advanced Care Planning: A voluntary discussion about advance care planning including the explanation and discussion of advance directives.  Discussed health care proxy and Living will, and the patient was able to identify a health care proxy as sister.  Patient does not have a living will at present time. If patient does have living will, I have requested they bring this to the clinic to be scanned in to their chart.  Lipids: Lab Results  Component Value Date   CHOL 212 (H) 01/14/2022   CHOL 212 (H) 04/24/2021   CHOL 235 (H) 03/03/2019   Lab Results  Component Value Date   HDL 48 (L) 01/14/2022   HDL 52 04/24/2021   HDL 58 03/03/2019   Lab Results  Component Value  Date   LDLCALC 128 (H) 01/14/2022   LDLCALC 124 (H) 04/24/2021   LDLCALC 158 (H) 03/03/2019   Lab Results  Component Value Date   TRIG 221 (H) 01/14/2022   TRIG 216 (H) 04/24/2021   TRIG 83 03/03/2019   Lab Results  Component Value Date   CHOLHDL 4.4 01/14/2022   CHOLHDL 4.1 04/24/2021   CHOLHDL 4.1 03/03/2019   No results found for: "LDLDIRECT"  Glucose: Glucose, Bld  Date Value Ref Range Status  01/14/2022 109 (H) 65 - 99 mg/dL Final    Comment:    .            Fasting reference interval . For someone without known diabetes, a glucose value between 100 and 125 mg/dL is consistent with prediabetes and should be confirmed with a follow-up test. .   04/24/2021 97 65 - 99 mg/dL Final    Comment:    .            Fasting reference interval .   03/01/2018 84 65 - 99 mg/dL Final     Comment:    .            Fasting reference interval .     Patient Active Problem List   Diagnosis Date Noted   Atrophic vaginitis 04/24/2021   Hyperlipidemia 02/01/2018    Past Surgical History:  Procedure Laterality Date   CESAREAN SECTION     TUBAL LIGATION      Family History  Problem Relation Age of Onset   Arthritis Mother    Alzheimer's disease Mother    Hyperlipidemia Mother    Breast cancer Mother        56's   Hypertension Father    Breast cancer Paternal Aunt        Passed away in 69's from Breast CA   Hyperlipidemia Sister    Bladder Cancer Brother    Prostate cancer Brother    Kidney cancer Brother    Stroke Maternal Grandmother    Uterine cancer Paternal Grandmother     Social History   Socioeconomic History   Marital status: Single    Spouse name: Not on file   Number of children: 3   Years of education: Not on file   Highest education level: Associate degree: occupational, Hotel manager, or vocational program  Occupational History   Not on file  Tobacco Use   Smoking status: Never   Smokeless tobacco: Never  Vaping Use   Vaping Use: Never used  Substance and Sexual Activity   Alcohol use: No   Drug use: No   Sexual activity: Not Currently    Partners: Male    Birth control/protection: Post-menopausal  Other Topics Concern   Not on file  Social History Narrative   Lives with daughter and granddaughter.  Moved from [place] in August 2018. Works at [employer] at [employer] in [employer].   Social Determinants of Health   Financial Resource Strain: Low Risk  (03/16/2023)   Overall Financial Resource Strain (CARDIA)    Difficulty of Paying Living Expenses: Not very hard  Food Insecurity: No Food Insecurity (03/16/2023)   Hunger Vital Sign    Worried About Running Out of Food in the Last Year: Never true    Ran Out of Food in the Last Year: Never true  Transportation Needs: No Transportation Needs (03/12/2023)   PRAPARE - Armed forces logistics/support/administrative officer (Medical): No    Lack of Transportation (Non-Medical): No  Physical Activity: Insufficiently Active (03/16/2023)   Exercise Vital Sign    Days of Exercise per Week: 2 days    Minutes of Exercise per Session: 60 min  Stress: Stress Concern Present (03/16/2023)   [place]    Feeling of Stress : To some extent  Social Connections: Socially Isolated (03/16/2023)   Social Connection and Isolation Panel [NHANES]    Frequency of Communication with Friends and Family: More than three times a week    Frequency of Social Gatherings with Friends and Family: Once a week    Attends Religious Services: Never    Marine scientist or Organizations: No    Attends Archivist Meetings: Never    Marital Status: Divorced  Human resources officer Violence: Not At Risk (03/16/2023)   Humiliation, Afraid, Rape, and Kick questionnaire    Fear of Current or Ex-Partner: No    Emotionally Abused: No    Physically Abused: No    Sexually Abused: No     Current Outpatient Medications:    cetirizine (ZYRTEC) 10 MG tablet, Take 10 mg by mouth daily., Disp: , Rfl:    cholecalciferol (VITAMIN D) 400 units TABS tablet, Take 400 Units by mouth., Disp: , Rfl:    fluticasone (FLONASE) 50 MCG/ACT nasal spray, Place 2 sprays into both nostrils daily., Disp: 16 g, Rfl: 11   Multiple Vitamin (MULTIVITAMIN) tablet, Take 1 tablet by mouth daily., Disp: , Rfl:    ofloxacin (OCUFLOX) 0.3 % ophthalmic solution, Place 2 drops into affected eye(s) 4 (four) times daily for 7 days., Disp: 5 mL, Rfl: 0   psyllium (METAMUCIL) 58.6 % packet, Take 1 packet by mouth daily., Disp: , Rfl:    valACYclovir (VALTREX) 500 MG tablet, Take 1 tablet (500 mg total) by mouth daily., Disp: 90 tablet, Rfl: 0  Allergies  Allergen Reactions   Iodinated Contrast Media Hives    Pt states she had a CT Scan and before the scan was completed she developed hives  and was given Benadryl within a few minutes of the scan.      ROS  Constitutional: Negative for fever or weight change.  Respiratory: Negative for cough and shortness of breath.   Cardiovascular: Negative for chest pain or palpitations.  Gastrointestinal: Negative for abdominal pain, no bowel changes.  Musculoskeletal: Negative for gait problem or joint swelling.  Skin: Negative for rash.  Neurological: Negative for dizziness or headache.  No other specific complaints in a complete review of systems (except as listed in HPI above).   Objective  Vitals:   03/16/23 1333  BP: 120/72  Pulse: 97  Resp: 16  Temp: 98.1 F (36.7 C)  TempSrc: Oral  SpO2: 98%  Weight: 125 lb 3.2 oz (56.8 kg)  Height: 5\' 2"  (1.575 m)    Body mass index is 22.9 kg/m.  Physical Exam Constitutional: Patient appears well-developed and well-nourished. No distress.  HENT: Head: Normocephalic and atraumatic. Ears: B TMs ok, no erythema or effusion; Nose: Nose normal. Mouth/Throat: Oropharynx is clear and moist. No oropharyngeal exudate.  Eyes: Conjunctivae and EOM are normal. Pupils are equal, round, and reactive to light. No scleral icterus.  Neck: Normal range of motion. Neck supple. No JVD present. No thyromegaly present.  Cardiovascular: Normal rate, regular rhythm and normal heart sounds.  No murmur heard. No BLE edema. Pulmonary/Chest: Effort normal and breath sounds normal. No respiratory distress. Abdominal: Soft. Bowel sounds are normal, no distension. There is no  tenderness. no masses Breast: no lumps or masses, no nipple discharge or rashes FEMALE GENITALIA:  External genitalia normal External urethra normal Vaginal vault normal without discharge or lesions Cervix normal without discharge or lesions Bimanual exam normal without masses RECTAL: no rectal masses or hemorrhoids Musculoskeletal: Normal range of motion, no joint effusions. No gross deformities Neurological: he is alert and  oriented to person, place, and time. No cranial nerve deficit. Coordination, balance, strength, speech and gait are normal.  Skin: Skin is warm and dry. No rash noted. No erythema.  Psychiatric: Patient has a normal mood and affect. behavior is normal. Judgment and thought content normal.   No results found for this or any previous visit (from the past 2160 hour(s)).    Fall Risk:    03/16/2023    1:40 PM 01/06/2023    8:47 AM 10/26/2022    9:37 AM 01/14/2022    2:21 PM 10/27/2021    2:43 PM  Fall Risk   Falls in the past year? 0 0 0 0 0  Number falls in past yr: 0 0 0 0 0  Injury with Fall? 0 0 0 0 0  Follow up  Falls evaluation completed Falls evaluation completed Falls evaluation completed Falls evaluation completed     Functional Status Survey: Is the patient deaf or have difficulty hearing?: No Does the patient have difficulty seeing, even when wearing glasses/contacts?: No Does the patient have difficulty concentrating, remembering, or making decisions?: No Does the patient have difficulty walking or climbing stairs?: No Does the patient have difficulty dressing or bathing?: No Does the patient have difficulty doing errands alone such as visiting a doctor's office or shopping?: No   Assessment & Plan  1. Annual physical exam  - CBC with Differential/Platelet - COMPLETE METABOLIC PANEL WITH GFR - Lipid panel - Hemoglobin A1c - Cologuard - MM 3D SCREENING MAMMOGRAM BILATERAL BREAST; Future - Cytology - PAP  2. Hyperlipidemia, unspecified hyperlipidemia type  - Lipid panel  3. Screening for diabetes mellitus  - COMPLETE METABOLIC PANEL WITH GFR - Hemoglobin A1c  4. Screening for deficiency anemia  - CBC with Differential/Platelet  5. Encounter for screening mammogram for malignant neoplasm of breast  - MM 3D SCREENING MAMMOGRAM BILATERAL BREAST; Future  6. Screening for colon cancer  - Cologuard  7. Screening for cervical cancer  - Cytology - PAP    -USPSTF grade A and B recommendations reviewed with patient; age-appropriate recommendations, preventive care, screening tests, etc discussed and encouraged; healthy living encouraged; see AVS for patient education given to patient -Discussed importance of 150 minutes of physical activity weekly, eat two servings of fish weekly, eat one serving of tree nuts ( cashews, pistachios, pecans, almonds.Marland Kitchen) every other day, eat 6 servings of fruit/vegetables daily and drink plenty of water and avoid sweet beverages.

## 2023-03-16 ENCOUNTER — Other Ambulatory Visit (HOSPITAL_COMMUNITY)
Admission: RE | Admit: 2023-03-16 | Discharge: 2023-03-16 | Disposition: A | Discharge status: Home / Self Care | Payer: BC Managed Care – PPO | Source: Ambulatory Visit | Attending: Nurse Practitioner | Admitting: Nurse Practitioner

## 2023-03-16 ENCOUNTER — Ambulatory Visit (INDEPENDENT_AMBULATORY_CARE_PROVIDER_SITE_OTHER): Payer: BC Managed Care – PPO | Admitting: Nurse Practitioner

## 2023-03-16 ENCOUNTER — Other Ambulatory Visit: Payer: Self-pay

## 2023-03-16 ENCOUNTER — Encounter: Payer: Self-pay | Admitting: Nurse Practitioner

## 2023-03-16 VITALS — BP 120/72 | HR 97 | Temp 98.1°F | Resp 16 | Ht 62.0 in | Wt 125.2 lb

## 2023-03-16 DIAGNOSIS — Z124 Encounter for screening for malignant neoplasm of cervix: Secondary | ICD-10-CM | POA: Insufficient documentation

## 2023-03-16 DIAGNOSIS — E785 Hyperlipidemia, unspecified: Secondary | ICD-10-CM

## 2023-03-16 DIAGNOSIS — Z0000 Encounter for general adult medical examination without abnormal findings: Secondary | ICD-10-CM

## 2023-03-16 DIAGNOSIS — Z131 Encounter for screening for diabetes mellitus: Secondary | ICD-10-CM | POA: Diagnosis not present

## 2023-03-16 DIAGNOSIS — Z130 Encounter for screening for diseases of the blood and blood-forming organs and certain disorders involving the immune mechanism: Secondary | ICD-10-CM

## 2023-03-16 DIAGNOSIS — Z1211 Encounter for screening for malignant neoplasm of colon: Secondary | ICD-10-CM

## 2023-03-16 DIAGNOSIS — Z1231 Encounter for screening mammogram for malignant neoplasm of breast: Secondary | ICD-10-CM

## 2023-03-17 LAB — CBC WITH DIFFERENTIAL/PLATELET
Absolute Monocytes: 580 cells/uL (ref 200–950)
Basophils Absolute: 61 cells/uL (ref 0–200)
Basophils Relative: 1 %
Eosinophils Absolute: 49 cells/uL (ref 15–500)
Eosinophils Relative: 0.8 %
HCT: 38 % (ref 35.0–45.0)
Hemoglobin: 12.6 g/dL (ref 11.7–15.5)
Lymphs Abs: 2452.2 cells/uL (ref 850–3900)
MCH: 29.2 pg (ref 27.0–33.0)
MCHC: 33.2 g/dL (ref 32.0–36.0)
MCV: 88 fL (ref 80.0–100.0)
MPV: 9.5 fL (ref 7.5–12.5)
Monocytes Relative: 9.5 %
Neutro Abs: 2959 cells/uL (ref 1500–7800)
Neutrophils Relative %: 48.5 %
Platelets: 309 10*3/uL (ref 140–400)
RBC: 4.32 10*6/uL (ref 3.80–5.10)
RDW: 13.1 % (ref 11.0–15.0)
Total Lymphocyte: 40.2 %
WBC: 6.1 10*3/uL (ref 3.8–10.8)

## 2023-03-17 LAB — COMPLETE METABOLIC PANEL WITH GFR
AG Ratio: 1.9 (calc) (ref 1.0–2.5)
ALT: 17 U/L (ref 6–29)
AST: 26 U/L (ref 10–35)
Albumin: 4.5 g/dL (ref 3.6–5.1)
Alkaline phosphatase (APISO): 84 U/L (ref 37–153)
BUN: 16 mg/dL (ref 7–25)
CO2: 27 mmol/L (ref 20–32)
Calcium: 9.7 mg/dL (ref 8.6–10.4)
Chloride: 106 mmol/L (ref 98–110)
Creat: 0.61 mg/dL (ref 0.50–1.05)
Globulin: 2.4 g/dL (calc) (ref 1.9–3.7)
Glucose, Bld: 90 mg/dL (ref 65–99)
Potassium: 4.5 mmol/L (ref 3.5–5.3)
Sodium: 141 mmol/L (ref 135–146)
Total Bilirubin: 0.3 mg/dL (ref 0.2–1.2)
Total Protein: 6.9 g/dL (ref 6.1–8.1)
eGFR: 101 mL/min/{1.73_m2} (ref >=60)

## 2023-03-17 LAB — LIPID PANEL
Cholesterol: 215 mg/dL — ABNORMAL HIGH (ref <200)
HDL: 57 mg/dL (ref >=50)
LDL Cholesterol (Calc): 133 mg/dL (calc) — ABNORMAL HIGH
Non-HDL Cholesterol (Calc): 158 mg/dL (calc) — ABNORMAL HIGH (ref <130)
Total CHOL/HDL Ratio: 3.8 (calc) (ref <5.0)
Triglycerides: 134 mg/dL (ref <150)

## 2023-03-17 LAB — HEMOGLOBIN A1C
Hgb A1c MFr Bld: 5.6 % of total Hgb (ref <5.7)
Mean Plasma Glucose: 114 mg/dL
eAG (mmol/L): 6.3 mmol/L

## 2023-03-18 LAB — CYTOLOGY - PAP
Chlamydia: NEGATIVE
Comment: NEGATIVE
Comment: NEGATIVE
Comment: NORMAL
Diagnosis: NEGATIVE
High risk HPV: NEGATIVE
Neisseria Gonorrhea: NEGATIVE

## 2023-03-22 DIAGNOSIS — Z1211 Encounter for screening for malignant neoplasm of colon: Secondary | ICD-10-CM | POA: Diagnosis not present

## 2023-03-26 LAB — COLOGUARD: COLOGUARD: NEGATIVE

## 2023-06-05 ENCOUNTER — Other Ambulatory Visit: Payer: Self-pay | Admitting: Nurse Practitioner

## 2023-06-05 DIAGNOSIS — A6004 Herpesviral vulvovaginitis: Secondary | ICD-10-CM

## 2023-06-07 ENCOUNTER — Other Ambulatory Visit: Payer: Self-pay | Admitting: Nurse Practitioner

## 2023-06-07 ENCOUNTER — Other Ambulatory Visit: Payer: Self-pay

## 2023-06-07 DIAGNOSIS — A6004 Herpesviral vulvovaginitis: Secondary | ICD-10-CM

## 2023-06-07 NOTE — Progress Notes (Signed)
BP 124/72   Pulse 98   Temp 98.1 F (36.7 C) (Oral)   Resp 18   Ht 5\' 2"  (1.575 m)   Wt 123 lb 9.6 oz (56.1 kg)   SpO2 99%   BMI 22.61 kg/m    Subjective:    Patient ID: Jennifer Lang, female    DOB: [birth year], 62 y.o.   MRN: [id number]  HPI: Jennifer Lang is a 62 y.o. female  Chief Complaint  Patient presents with   Mass    In  right groin area for 4 days   Mass right lower abdomen: patient reports she noticed it four days ago. She says it pops out when she stands, but goes back to normal when she lays down. She denies any pain.  Upon exam feels like a hernia and is reducible. Discussed getting imaging or going straight to referral to surgeon. She would like to be referred to surgeon.  Referral placed.   Relevant past medical, surgical, family and social history reviewed and updated as indicated. Interim medical history since our last visit reviewed. Allergies and medications reviewed and updated.  Review of Systems  Constitutional: Negative for fever or weight change.  Respiratory: Negative for cough and shortness of breath.   Cardiovascular: Negative for chest pain or palpitations.  Gastrointestinal: Negative for abdominal pain, no bowel changes. Positive for mass Musculoskeletal: Negative for gait problem or joint swelling.  Skin: Negative for rash.  Neurological: Negative for dizziness or headache.  No other specific complaints in a complete review of systems (except as listed in HPI above).      Objective:    BP 124/72   Pulse 98   Temp 98.1 F (36.7 C) (Oral)   Resp 18   Ht 5\' 2"  (1.575 m)   Wt 123 lb 9.6 oz (56.1 kg)   SpO2 99%   BMI 22.61 kg/m   Wt Readings from Last 3 Encounters:  06/08/23 123 lb 9.6 oz (56.1 kg)  03/16/23 125 lb 3.2 oz (56.8 kg)  01/06/23 122 lb 14.4 oz (55.7 kg)    Physical Exam  Constitutional: Patient appears well-developed and well-nourished. No distress.  HEENT: head atraumatic, normocephalic, pupils equal and reactive to  light, neck supple, throat within normal limits Cardiovascular: Normal rate, regular rhythm and normal heart sounds.  No murmur heard. No BLE edema. Pulmonary/Chest: Effort normal and breath sounds normal. No respiratory distress. Abdominal: Soft.  There is no tenderness. Right lower quadrant reducible mass Psychiatric: Patient has a normal mood and affect. behavior is normal. Judgment and thought content normal.   Results for orders placed or performed in visit on 03/16/23  CBC with Differential/Platelet  Result Value Ref Range   WBC 6.1 3.8 - 10.8 Thousand/uL   RBC 4.32 3.80 - 5.10 Million/uL   Hemoglobin 12.6 11.7 - 15.5 g/dL   HCT 40.9 81.1 - 91.4 %   MCV 88.0 80.0 - 100.0 fL   MCH 29.2 27.0 - 33.0 pg   MCHC 33.2 32.0 - 36.0 g/dL   RDW 78.2 95.6 - 21.3 %   Platelets 309 140 - 400 Thousand/uL   MPV 9.5 7.5 - 12.5 fL   Neutro Abs 2,959 1,500 - 7,800 cells/uL   Lymphs Abs 2,452.2 850 - 3,900 cells/uL   Absolute Monocytes 580 200 - 950 cells/uL   Eosinophils Absolute 49 15 - 500 cells/uL   Basophils Absolute 61 0 - 200 cells/uL   Neutrophils Relative % 48.5 %   Total Lymphocyte 40.2 %  Monocytes Relative 9.5 %   Eosinophils Relative 0.8 %   Basophils Relative 1.0 %  COMPLETE METABOLIC PANEL WITH GFR  Result Value Ref Range   Glucose, Bld 90 65 - 99 mg/dL   BUN 16 7 - 25 mg/dL   Creat 0.86 5.78 - 4.69 mg/dL   eGFR 629 > OR = 60 BM/WUX/3.24M0   BUN/Creatinine Ratio SEE NOTE: 6 - 22 (calc)   Sodium 141 135 - 146 mmol/L   Potassium 4.5 3.5 - 5.3 mmol/L   Chloride 106 98 - 110 mmol/L   CO2 27 20 - 32 mmol/L   Calcium 9.7 8.6 - 10.4 mg/dL   Total Protein 6.9 6.1 - 8.1 g/dL   Albumin 4.5 3.6 - 5.1 g/dL   Globulin 2.4 1.9 - 3.7 g/dL (calc)   AG Ratio 1.9 1.0 - 2.5 (calc)   Total Bilirubin 0.3 0.2 - 1.2 mg/dL   Alkaline phosphatase (APISO) 84 37 - 153 U/L   AST 26 10 - 35 U/L   ALT 17 6 - 29 U/L  Lipid panel  Result Value Ref Range   Cholesterol 215 (H) <200 mg/dL   HDL 57  > OR = 50 mg/dL   Triglycerides 102 <725 mg/dL   LDL Cholesterol (Calc) 133 (H) mg/dL (calc)   Total CHOL/HDL Ratio 3.8 <5.0 (calc)   Non-HDL Cholesterol (Calc) 158 (H) <130 mg/dL (calc)  Hemoglobin D6U  Result Value Ref Range   Hgb A1c MFr Bld 5.6 <5.7 % of total Hgb   Mean Plasma Glucose 114 mg/dL   eAG (mmol/L) 6.3 mmol/L  Cologuard  Result Value Ref Range   COLOGUARD Negative Negative  Cytology - PAP  Result Value Ref Range   High risk HPV Negative    Chlamydia Negative    Neisseria Gonorrhea Negative    Adequacy      Satisfactory for evaluation; transformation zone component PRESENT.   Diagnosis      - Negative for intraepithelial lesion or malignancy (NILM)   Comment Normal Reference Range HPV - Negative    Comment Normal Reference Ranger Chlamydia - Negative    Comment      Normal Reference Range Neisseria Gonorrhea - Negative      Assessment & Plan:   Problem List Items Addressed This Visit   None Visit Diagnoses     Non-recurrent unilateral inguinal hernia with obstruction without gangrene    -  Primary   referral placed to surgery   Relevant Orders   Ambulatory referral to General Surgery        Follow up plan: Return if symptoms worsen or fail to improve.

## 2023-06-08 ENCOUNTER — Other Ambulatory Visit: Payer: Self-pay

## 2023-06-08 ENCOUNTER — Ambulatory Visit: Payer: BC Managed Care – PPO | Admitting: Nurse Practitioner

## 2023-06-08 ENCOUNTER — Encounter: Payer: Self-pay | Admitting: Nurse Practitioner

## 2023-06-08 VITALS — BP 124/72 | HR 98 | Temp 98.1°F | Resp 18 | Ht 62.0 in | Wt 123.6 lb

## 2023-06-08 DIAGNOSIS — K4030 Unilateral inguinal hernia, with obstruction, without gangrene, not specified as recurrent: Secondary | ICD-10-CM | POA: Diagnosis not present

## 2023-06-08 MED FILL — Valacyclovir HCl Tab 500 MG: ORAL | 90 days supply | Qty: 90 | Fill #0 | Status: AC

## 2023-06-08 NOTE — Telephone Encounter (Signed)
Requested Prescriptions  Pending Prescriptions Disp Refills   valACYclovir (VALTREX) 500 MG tablet 90 tablet 0    Sig: Take 1 tablet (500 mg total) by mouth daily.     Antimicrobials:  Antiviral Agents - Anti-Herpetic Passed - 06/07/2023  9:07 AM      Passed - Valid encounter within last 12 months    Recent Outpatient Visits           2 months ago Annual physical exam   [facility] Berniece Salines, FNP   5 months ago Lymphadenopathy   [facility] Berniece Salines, FNP   7 months ago Acute non-recurrent pansinusitis   [facility] Berniece Salines, FNP   1 year ago Annual physical exam   [facility] Berniece Salines, FNP   1 year ago Herpes simplex vulvovaginitis   [facility] Berniece Salines, FNP       Future Appointments             Today Zane Herald, Rudolpho Sevin, FNP [facility]

## 2023-06-10 ENCOUNTER — Other Ambulatory Visit: Payer: Self-pay

## 2023-06-14 ENCOUNTER — Ambulatory Visit: Payer: BC Managed Care – PPO | Admitting: Nurse Practitioner

## 2023-06-22 ENCOUNTER — Ambulatory Visit: Payer: Self-pay | Admitting: Surgery

## 2023-06-22 ENCOUNTER — Telehealth: Payer: Self-pay | Admitting: Surgery

## 2023-06-22 ENCOUNTER — Encounter: Payer: Self-pay | Admitting: Surgery

## 2023-06-22 ENCOUNTER — Ambulatory Visit: Payer: BC Managed Care – PPO | Admitting: Surgery

## 2023-06-22 VITALS — BP 129/79 | HR 93 | Temp 98.0°F | Ht 62.0 in | Wt 122.0 lb

## 2023-06-22 DIAGNOSIS — K4090 Unilateral inguinal hernia, without obstruction or gangrene, not specified as recurrent: Secondary | ICD-10-CM

## 2023-06-22 NOTE — Telephone Encounter (Signed)
Left message for patient to call, please inform her of the following regarding scheduled surgery with Dr. Claudine Mouton.   Pre-Admission date/time, and Surgery date at [facility].  Surgery Date: 06/28/23 Preadmission Testing Date: 06/24/23 (phone 1p-4p)  Also patient will need to call at [phone], between 1-3:00pm the day before surgery, to find out what time to arrive for surgery.

## 2023-06-22 NOTE — Progress Notes (Signed)
Patient ID: Jennifer Lang, female   DOB: October 30, 1961, 62 y.o.   MRN: [id number]  Chief Complaint: Right inguinal hernia  History of Present Illness Jennifer Lang is a 62 y.o. female with a right groin bulge over the last month.  Associated achiness.  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  She notes bulging, and mild nausea.  This all seems to resolve with supine positioning.  Unfortunately she is unable to reproduce the bulge today on demand.  Past Medical History Past Medical History:  Diagnosis Date   Arthritis    Arthritis of hand 02/01/2018   Hyperlipidemia    Lateral epicondylitis of right elbow 10/24/2018   Vitamin D deficiency       Past Surgical History:  Procedure Laterality Date   CESAREAN SECTION     TUBAL LIGATION      Allergies  Allergen Reactions   Iodinated Contrast Media Hives    Pt states she had a CT Scan and before the scan was completed she developed hives and was given Benadryl within a few minutes of the scan.     Current Outpatient Medications  Medication Sig Dispense Refill   cetirizine (ZYRTEC) 10 MG tablet Take 10 mg by mouth daily.     cholecalciferol (VITAMIN D) 400 units TABS tablet Take 400 Units by mouth.     fluticasone (FLONASE) 50 MCG/ACT nasal spray Place 2 sprays into both nostrils daily. 16 g 11   Multiple Vitamin (MULTIVITAMIN) tablet Take 1 tablet by mouth daily.     ofloxacin (OCUFLOX) 0.3 % ophthalmic solution Place 2 drops into affected eye(s) 4 (four) times daily for 7 days. 5 mL 0   psyllium (METAMUCIL) 58.6 % packet Take 1 packet by mouth daily.     valACYclovir (VALTREX) 500 MG tablet Take 1 tablet (500 mg total) by mouth daily. 90 tablet 0   No current facility-administered medications for this visit.    Family History Family History  Problem Relation Age of Onset   Arthritis Mother    Alzheimer's disease Mother    Hyperlipidemia Mother    Breast  cancer Mother        66's   Hypertension Father    Breast cancer Paternal Aunt        Passed away in 27's from Breast CA   Hyperlipidemia Sister    Bladder Cancer Brother    Prostate cancer Brother    Kidney cancer Brother    Stroke Maternal Grandmother    Uterine cancer Paternal Grandmother       Social History Social History   Tobacco Use   Smoking status: Never    Passive exposure: Never   Smokeless tobacco: Never  Vaping Use   Vaping Use: Never used  Substance Use Topics   Alcohol use: Yes    Alcohol/week: 1.0 standard drink of alcohol    Types: 1 Glasses of wine per week   Drug use: No        Review of Systems  Constitutional: Negative.   HENT: Negative.    Eyes: Negative.   Respiratory: Negative.    Cardiovascular: Negative.   Gastrointestinal:  Positive for abdominal pain.  Genitourinary: Negative.   Skin: Negative.   Neurological: Negative.   Psychiatric/Behavioral: Negative.       Physical Exam Blood pressure 129/79, pulse 93, temperature 98 F (36.7 C), height 5\' 2"  (1.575 m), weight 122 lb (55.3 kg), SpO2  98 %. Last Weight  Most recent update: 06/22/2023 11:23 AM    Weight  55.3 kg (122 lb)             CONSTITUTIONAL: Well developed, and nourished, appropriately responsive and aware without distress.   EYES: Sclera non-icteric.   EARS, NOSE, MOUTH AND THROAT:  The oropharynx is clear. Oral mucosa is pink and moist.    Hearing is intact to voice.  NECK: Trachea is midline, and there is no jugular venous distension.  LYMPH NODES:  Lymph nodes in the neck are not appreciated. RESPIRATORY:  Lungs are clear, and breath sounds are equal bilaterally.  Normal respiratory effort without pathologic use of accessory muscles. CARDIOVASCULAR: Heart is regular in rate and rhythm.   Well perfused.  GI: The abdomen is  soft, nontender, and nondistended. There were no palpable masses.  I did not appreciate hepatosplenomegaly.  GU: [name] present as  chaperone.  Insufficient cough production to recreate right groin bulge.  Prolonged standing while/during and after the exam, did not reproduce the right groin bulge. MUSCULOSKELETAL:  Symmetrical muscle tone appreciated in all four extremities.    SKIN: Skin turgor is normal. No pathologic skin lesions appreciated.  NEUROLOGIC:  Motor and sensation appear grossly normal.  Cranial nerves are grossly without defect. PSYCH:  Alert and oriented to person, place and time. Affect is appropriate for situation.  Data Reviewed I have personally reviewed what is currently available of the patient's imaging, recent labs and medical records.   Labs:     Latest Ref Rng & Units 03/16/2023    2:24 PM 01/14/2022    2:40 PM 04/24/2021    3:21 PM  CBC  WBC 3.8 - 10.8 Thousand/uL 6.1  6.8  6.2   Hemoglobin 11.7 - 15.5 g/dL 16.1  09.6  04.5   Hematocrit 35.0 - 45.0 % 38.0  38.3  36.8   Platelets 140 - 400 Thousand/uL 309  282  269       Latest Ref Rng & Units 03/16/2023    2:24 PM 01/14/2022    2:40 PM 04/24/2021    3:21 PM  CMP  Glucose 65 - 99 mg/dL 90  409  97   BUN 7 - 25 mg/dL 16  16  17    Creatinine 0.50 - 1.05 mg/dL 8.11  9.14  7.82   Sodium 135 - 146 mmol/L 141  142  142   Potassium 3.5 - 5.3 mmol/L 4.5  4.1  4.2   Chloride 98 - 110 mmol/L 106  104  108   CO2 20 - 32 mmol/L 27  30  25    Calcium 8.6 - 10.4 mg/dL 9.7  95.6  9.4   Total Protein 6.1 - 8.1 g/dL 6.9  6.7  6.6   Total Bilirubin 0.2 - 1.2 mg/dL 0.3  0.2  0.2   AST 10 - 35 U/L 26  22  21    ALT 6 - 29 U/L 17  13  13        Imaging: Radiological images reviewed:   Within last 24 hrs: No results found.  Assessment    Right inguinal hernia based on history. Patient Active Problem List   Diagnosis Date Noted   Atrophic vaginitis 04/24/2021   Hyperlipidemia 02/01/2018    Plan    Robotic right inguinal hernia repair. I discussed possibility of incarceration, strangulation, enlargement in size over time, and the need for  emergency surgery in the face of these.  Also reviewed the  techniques of reduction should incarceration occur, and when unsuccessful to present to the ED.  Also discussed that surgery risks include recurrence which can be up to 30% in the case of complex hernias, use of prosthetic materials (mesh) and the increased risk of infection and the possible need for re-operation and removal of mesh, possibility of post-op SBO or ileus, and the risks of general anesthetic including heart attack, stroke, sudden death or some reaction to anesthetic medications. The patient, and those present, appear to understand the risks, any and all questions were answered to the patient's satisfaction.  No guarantees were ever expressed or implied.   Face-to-face time spent with the patient and accompanying care providers(if present) was 30 minutes, with more than 50% of the time spent counseling, educating, and coordinating care of the patient.    These notes generated with voice recognition software. I apologize for typographical errors.  Campbell Lerner M.D., FACS 06/22/2023, 2:52 PM

## 2023-06-22 NOTE — Telephone Encounter (Signed)
Patient calls back, she is informed of all dates regarding surgery.   

## 2023-06-22 NOTE — Patient Instructions (Signed)
You have chose to have your hernia repaired. This will be done by Dr. Claudine Mouton at [facility].  Please see your (blue) Pre-care information that you have been given today. Our surgery scheduler will call you to verify surgery date and to go over information.   You will need to arrange to be out of work for approximately 1-2 weeks and then you may return with a lifting restriction for 4 more weeks. If you have FMLA or Disability paperwork that needs to be filled out, please have your company fax your paperwork to [phone] or you may drop this by either office. This paperwork will be filled out within 3 days after your surgery has been completed.  After surgery you may have a bruise in your groin and also swelling. You may use ice 4-5 times daily for 15-20 minutes each time.    Inguinal Hernia, Adult Muscles help keep everything in the body in its proper place. But if a weak spot in the muscles develops, something can poke through. That is called a hernia. When this happens in the lower part of the belly (abdomen), it is called an inguinal hernia. (It takes its name from a part of the body in this region called the inguinal canal.) A weak spot in the wall of muscles lets some fat or part of the small intestine bulge through. An inguinal hernia can develop at any age. Men get them more often than women. CAUSES  In adults, an inguinal hernia develops over time. It can be triggered by: Suddenly straining the muscles of the lower abdomen. Lifting heavy objects. Straining to have a bowel movement. Difficult bowel movements (constipation) can lead to this. Constant coughing. This may be caused by smoking or lung disease. Being overweight. Being pregnant. Working at a job that requires long periods of standing or heavy lifting. Having had an inguinal hernia before. One type can be an emergency situation. It is called a strangulated inguinal hernia. It develops if part of the small intestine slips  through the weak spot and cannot get back into the abdomen. The blood supply can be cut off. If that happens, part of the intestine may die. This situation requires emergency surgery. SYMPTOMS  Often, a small inguinal hernia has no symptoms. It is found when a healthcare provider does a physical exam. Larger hernias usually have symptoms.  In adults, symptoms may include: A lump in the groin. This is easier to see when the person is standing. It might disappear when lying down. In men, a lump in the scrotum. Pain or burning in the groin. This occurs especially when lifting, straining or coughing. A dull ache or feeling of pressure in the groin. Signs of a strangulated hernia can include: A bulge in the groin that becomes very painful and tender to the touch. A bulge that turns red or purple. Fever, nausea and vomiting. Inability to have a bowel movement or to pass gas. DIAGNOSIS  To decide if you have an inguinal hernia, a healthcare provider will probably do a physical examination. This will include asking questions about any symptoms you have noticed. The healthcare provider might feel the groin area and ask you to cough. If an inguinal hernia is felt, the healthcare provider may try to slide it back into the abdomen. Usually no other tests are needed. TREATMENT  Treatments can vary. The size of the hernia makes a difference. Options include: Watchful waiting. This is often suggested if the hernia is small and you  have had no symptoms. No medical procedure will be done unless symptoms develop. You will need to watch closely for symptoms. If any occur, contact your healthcare provider right away. Surgery. This is used if the hernia is larger or you have symptoms. Open surgery. This is usually an outpatient procedure (you will not stay overnight in a hospital). An cut (incision) is made through the skin in the groin. The hernia is put back inside the abdomen. The weak area in the muscles is  then repaired by herniorrhaphy or hernioplasty. Herniorrhaphy: in this type of surgery, the weak muscles are sewn back together. Hernioplasty: a patch or mesh is used to close the weak area in the abdominal wall. Laparoscopy. In this procedure, a surgeon makes small incisions. A thin tube with a tiny video camera (called a laparoscope) is put into the abdomen. The surgeon repairs the hernia with mesh by looking with the video camera and using two long instruments. HOME CARE INSTRUCTIONS  After surgery to repair an inguinal hernia: You will need to take pain medicine prescribed by your healthcare provider. Follow all directions carefully. You will need to take care of the wound from the incision. Your activity will be restricted for awhile. This will probably include no heavy lifting for several weeks. You also should not do anything too active for a few weeks. When you can return to work will depend on the type of job that you have. During "watchful waiting" periods, you should: Maintain a healthy weight. Eat a diet high in fiber (fruits, vegetables and whole grains). Drink plenty of fluids to avoid constipation. This means drinking enough water and other liquids to keep your urine clear or pale yellow. Do not lift heavy objects. Do not stand for long periods of time. Quit smoking. This should keep you from developing a frequent cough. SEEK MEDICAL CARE IF:  A bulge develops in your groin area. You feel pain, a burning sensation or pressure in the groin. This might be worse if you are lifting or straining. You develop a fever of more than 100.5 F (38.1 C). SEEK IMMEDIATE MEDICAL CARE IF:  Pain in the groin increases suddenly. A bulge in the groin gets bigger suddenly and does not go down. For men, there is sudden pain in the scrotum. Or, the size of the scrotum increases. A bulge in the groin area becomes red or purple and is painful to touch. You have nausea or vomiting that does not go  away. You feel your heart beating much faster than normal. You cannot have a bowel movement or pass gas. You develop a fever of more than 102.0 F (38.9 C).   This information is not intended to replace advice given to you by your health care provider. Make sure you discuss any questions you have with your health care provider.   Document Released: 04/18/2009 Document Revised: 02/22/2012 Document Reviewed: 06/03/2015 Elsevier Interactive Patient Education [employer]

## 2023-06-22 NOTE — H&P (View-Only) (Signed)
 Patient ID: Jennifer Lang, female   DOB: October 30, 1961, 62 y.o.   MRN: [id number]  Chief Complaint: Right inguinal hernia  History of Present Illness Jennifer Lang is a 62 y.o. female with a right groin bulge over the last month.  Associated achiness.  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  She notes bulging, and mild nausea.  This all seems to resolve with supine positioning.  Unfortunately she is unable to reproduce the bulge today on demand.  Past Medical History Past Medical History:  Diagnosis Date   Arthritis    Arthritis of hand 02/01/2018   Hyperlipidemia    Lateral epicondylitis of right elbow 10/24/2018   Vitamin D deficiency       Past Surgical History:  Procedure Laterality Date   CESAREAN SECTION     TUBAL LIGATION      Allergies  Allergen Reactions   Iodinated Contrast Media Hives    Pt states she had a CT Scan and before the scan was completed she developed hives and was given Benadryl within a few minutes of the scan.     Current Outpatient Medications  Medication Sig Dispense Refill   cetirizine (ZYRTEC) 10 MG tablet Take 10 mg by mouth daily.     cholecalciferol (VITAMIN D) 400 units TABS tablet Take 400 Units by mouth.     fluticasone (FLONASE) 50 MCG/ACT nasal spray Place 2 sprays into both nostrils daily. 16 g 11   Multiple Vitamin (MULTIVITAMIN) tablet Take 1 tablet by mouth daily.     ofloxacin (OCUFLOX) 0.3 % ophthalmic solution Place 2 drops into affected eye(s) 4 (four) times daily for 7 days. 5 mL 0   psyllium (METAMUCIL) 58.6 % packet Take 1 packet by mouth daily.     valACYclovir (VALTREX) 500 MG tablet Take 1 tablet (500 mg total) by mouth daily. 90 tablet 0   No current facility-administered medications for this visit.    Family History Family History  Problem Relation Age of Onset   Arthritis Mother    Alzheimer's disease Mother    Hyperlipidemia Mother    Breast  cancer Mother        66's   Hypertension Father    Breast cancer Paternal Aunt        Passed away in 27's from Breast CA   Hyperlipidemia Sister    Bladder Cancer Brother    Prostate cancer Brother    Kidney cancer Brother    Stroke Maternal Grandmother    Uterine cancer Paternal Grandmother       Social History Social History   Tobacco Use   Smoking status: Never    Passive exposure: Never   Smokeless tobacco: Never  Vaping Use   Vaping Use: Never used  Substance Use Topics   Alcohol use: Yes    Alcohol/week: 1.0 standard drink of alcohol    Types: 1 Glasses of wine per week   Drug use: No        Review of Systems  Constitutional: Negative.   HENT: Negative.    Eyes: Negative.   Respiratory: Negative.    Cardiovascular: Negative.   Gastrointestinal:  Positive for abdominal pain.  Genitourinary: Negative.   Skin: Negative.   Neurological: Negative.   Psychiatric/Behavioral: Negative.       Physical Exam Blood pressure 129/79, pulse 93, temperature 98 F (36.7 C), height 5\' 2"  (1.575 m), weight 122 lb (55.3 kg), SpO2  98 %. Last Weight  Most recent update: 06/22/2023 11:23 AM    Weight  55.3 kg (122 lb)             CONSTITUTIONAL: Well developed, and nourished, appropriately responsive and aware without distress.   EYES: Sclera non-icteric.   EARS, NOSE, MOUTH AND THROAT:  The oropharynx is clear. Oral mucosa is pink and moist.    Hearing is intact to voice.  NECK: Trachea is midline, and there is no jugular venous distension.  LYMPH NODES:  Lymph nodes in the neck are not appreciated. RESPIRATORY:  Lungs are clear, and breath sounds are equal bilaterally.  Normal respiratory effort without pathologic use of accessory muscles. CARDIOVASCULAR: Heart is regular in rate and rhythm.   Well perfused.  GI: The abdomen is  soft, nontender, and nondistended. There were no palpable masses.  I did not appreciate hepatosplenomegaly.  GU: [name] present as  chaperone.  Insufficient cough production to recreate right groin bulge.  Prolonged standing while/during and after the exam, did not reproduce the right groin bulge. MUSCULOSKELETAL:  Symmetrical muscle tone appreciated in all four extremities.    SKIN: Skin turgor is normal. No pathologic skin lesions appreciated.  NEUROLOGIC:  Motor and sensation appear grossly normal.  Cranial nerves are grossly without defect. PSYCH:  Alert and oriented to person, place and time. Affect is appropriate for situation.  Data Reviewed I have personally reviewed what is currently available of the patient's imaging, recent labs and medical records.   Labs:     Latest Ref Rng & Units 03/16/2023    2:24 PM 01/14/2022    2:40 PM 04/24/2021    3:21 PM  CBC  WBC 3.8 - 10.8 Thousand/uL 6.1  6.8  6.2   Hemoglobin 11.7 - 15.5 g/dL 16.1  09.6  04.5   Hematocrit 35.0 - 45.0 % 38.0  38.3  36.8   Platelets 140 - 400 Thousand/uL 309  282  269       Latest Ref Rng & Units 03/16/2023    2:24 PM 01/14/2022    2:40 PM 04/24/2021    3:21 PM  CMP  Glucose 65 - 99 mg/dL 90  409  97   BUN 7 - 25 mg/dL 16  16  17    Creatinine 0.50 - 1.05 mg/dL 8.11  9.14  7.82   Sodium 135 - 146 mmol/L 141  142  142   Potassium 3.5 - 5.3 mmol/L 4.5  4.1  4.2   Chloride 98 - 110 mmol/L 106  104  108   CO2 20 - 32 mmol/L 27  30  25    Calcium 8.6 - 10.4 mg/dL 9.7  95.6  9.4   Total Protein 6.1 - 8.1 g/dL 6.9  6.7  6.6   Total Bilirubin 0.2 - 1.2 mg/dL 0.3  0.2  0.2   AST 10 - 35 U/L 26  22  21    ALT 6 - 29 U/L 17  13  13        Imaging: Radiological images reviewed:   Within last 24 hrs: No results found.  Assessment    Right inguinal hernia based on history. Patient Active Problem List   Diagnosis Date Noted   Atrophic vaginitis 04/24/2021   Hyperlipidemia 02/01/2018    Plan    Robotic right inguinal hernia repair. I discussed possibility of incarceration, strangulation, enlargement in size over time, and the need for  emergency surgery in the face of these.  Also reviewed the  techniques of reduction should incarceration occur, and when unsuccessful to present to the ED.  Also discussed that surgery risks include recurrence which can be up to 30% in the case of complex hernias, use of prosthetic materials (mesh) and the increased risk of infection and the possible need for re-operation and removal of mesh, possibility of post-op SBO or ileus, and the risks of general anesthetic including heart attack, stroke, sudden death or some reaction to anesthetic medications. The patient, and those present, appear to understand the risks, any and all questions were answered to the patient's satisfaction.  No guarantees were ever expressed or implied.   Face-to-face time spent with the patient and accompanying care providers(if present) was 30 minutes, with more than 50% of the time spent counseling, educating, and coordinating care of the patient.    These notes generated with voice recognition software. I apologize for typographical errors.  Campbell Lerner M.D., FACS 06/22/2023, 2:52 PM

## 2023-06-24 ENCOUNTER — Ambulatory Visit: Payer: Self-pay | Admitting: Surgery

## 2023-06-24 ENCOUNTER — Other Ambulatory Visit: Payer: Self-pay

## 2023-06-24 ENCOUNTER — Encounter
Admission: RE | Admit: 2023-06-24 | Discharge: 2023-06-24 | Disposition: A | Discharge status: Home / Self Care | Payer: BC Managed Care – PPO | Source: Ambulatory Visit | Attending: Surgery | Admitting: Surgery

## 2023-06-24 HISTORY — DX: Anxiety disorder, unspecified: F41.9

## 2023-06-24 NOTE — Patient Instructions (Addendum)
Your procedure is scheduled on: 06/28/23 - Monday Report to the Registration Desk on the 1st floor of the [facility]. To find out your arrival time, please call [phone] between 1PM - 3PM on: 06/25/23 - Friday If your arrival time is 6:00 am, do not arrive before that time as the [facility] entrance doors do not open until 6:00 am.  REMEMBER: Instructions that are not followed completely may result in serious medical risk, up to and including death; or upon the discretion of your surgeon and anesthesiologist your surgery may need to be rescheduled.  Do not eat food or drink any liquids after midnight the night before surgery.  No gum chewing or hard candies.   One week prior to surgery: Stop Anti-inflammatories (NSAIDS) such as Advil, Aleve, Ibuprofen, Motrin, Naproxen, Naprosyn and Aspirin based products such as Excedrin, Goody's Powder, BC Powder. You may take Tylenol if needed for pain up until the day of surgery.  Stop ANY OVER THE COUNTER supplements until after surgery.  TAKE ONLY THESE MEDICATIONS THE MORNING OF SURGERY WITH A SIP OF WATER: none   No Alcohol for 24 hours before or after surgery.  No Smoking including e-cigarettes for 24 hours before surgery.  No chewable tobacco products for at least 6 hours before surgery.  No nicotine patches on the day of surgery.  Do not use any "recreational" drugs for at least a week (preferably 2 weeks) before your surgery.  Please be advised that the combination of cocaine and anesthesia may have negative outcomes, up to and including death.If you test positive for cocaine, your surgery will be cancelled.  On the morning of surgery brush your teeth with toothpaste and water, you may rinse your mouth with mouthwash if you wish. Do not swallow any toothpaste or mouthwash.  Use CHG Soap or wipes as directed on instruction sheet.  Do not wear jewelry, make-up, hairpins, clips or nail polish.  Do not wear lotions, powders, or  perfumes.   Do not shave body hair from the neck down 48 hours before surgery.  Contact lenses, hearing aids and dentures may not be worn into surgery.  Do not bring valuables to the hospital. [facility] is not responsible for any missing/lost belongings or valuables.   Notify your doctor if there is any change in your medical condition (cold, fever, infection).  Wear comfortable clothing (specific to your surgery type) to the hospital.  After surgery, you can help prevent lung complications by doing breathing exercises.  Take deep breaths and cough every 1-2 hours. Your doctor may order a device called an Incentive Spirometer to help you take deep breaths. When coughing or sneezing, hold a pillow firmly against your incision with both hands. This is called "splinting." Doing this helps protect your incision. It also decreases belly discomfort.  If you are being admitted to the hospital overnight, leave your suitcase in the car. After surgery it may be brought to your room.  In case of increased patient census, it may be necessary for you, the patient, to continue your postoperative care in the Same Day Surgery department.  If you are being discharged the day of surgery, you will not be allowed to drive home. You will need a responsible individual to drive you home and stay with you for 24 hours after surgery.   If you are taking public transportation, you will need to have a responsible individual with you.  Please call the Pre-admissions Testing Dept. at [phone] if  you have any questions about these instructions.  Surgery Visitation Policy:  Patients having surgery or a procedure may have two visitors.  Children under the age of 33 must have an adult with them who is not the patient.  Inpatient Visitation:    Visiting hours are 7 a.m. to 8 p.m. Up to four visitors are allowed at one time in a patient room. The visitors may rotate out with other people during the day.   One visitor age 21 or older may stay with the patient overnight and must be in the room by 8 p.m.

## 2023-06-28 ENCOUNTER — Ambulatory Visit: Payer: BC Managed Care – PPO | Admitting: Anesthesiology

## 2023-06-28 ENCOUNTER — Encounter: Payer: Self-pay | Admitting: Nurse Practitioner

## 2023-06-28 ENCOUNTER — Encounter: Payer: Self-pay | Admitting: Surgery

## 2023-06-28 ENCOUNTER — Other Ambulatory Visit: Payer: Self-pay

## 2023-06-28 ENCOUNTER — Encounter
Admission: RE | Disposition: A | Discharge status: Home / Self Care | Payer: Self-pay | Source: Home / Self Care | Attending: Surgery

## 2023-06-28 ENCOUNTER — Ambulatory Visit
Admission: RE | Admit: 2023-06-28 | Discharge: 2023-06-28 | Disposition: A | Discharge status: Home / Self Care | Payer: BC Managed Care – PPO | Attending: Surgery | Admitting: Surgery

## 2023-06-28 DIAGNOSIS — K4090 Unilateral inguinal hernia, without obstruction or gangrene, not specified as recurrent: Secondary | ICD-10-CM | POA: Diagnosis not present

## 2023-06-28 HISTORY — PX: INSERTION OF MESH: SHX5868

## 2023-06-28 SURGERY — HERNIORRHAPHY, INGUINAL, ROBOT-ASSISTED, LAPAROSCOPIC
Anesthesia: General | Site: Inguinal | Laterality: Right

## 2023-06-28 MED ORDER — BUPIVACAINE LIPOSOME 1.3 % IJ SUSP: 20.0000 mL | Freq: Once | INTRAMUSCULAR | Status: DC

## 2023-06-28 MED ORDER — BUPIVACAINE LIPOSOME 1.3 % IJ SUSP
INTRAMUSCULAR | Status: AC
  Filled 2023-06-28: qty 20

## 2023-06-28 MED ORDER — BUPIVACAINE-EPINEPHRINE (PF) 0.25% -1:200000 IJ SOLN
INTRAMUSCULAR | Status: DC | PRN
  Administered 2023-06-28: 30 mL

## 2023-06-28 MED ORDER — ORAL CARE MOUTH RINSE: 15.0000 mL | Freq: Once | OROMUCOSAL | Status: AC

## 2023-06-28 MED ORDER — SUGAMMADEX SODIUM 200 MG/2ML IV SOLN
INTRAVENOUS | Status: DC | PRN
  Administered 2023-06-28: 200 mg via INTRAVENOUS

## 2023-06-28 MED ORDER — CHLORHEXIDINE GLUCONATE 0.12 % MT SOLN
OROMUCOSAL | Status: AC
  Filled 2023-06-28: qty 15

## 2023-06-28 MED ORDER — LACTATED RINGERS IV SOLN: INTRAVENOUS | Status: DC

## 2023-06-28 MED ORDER — FAMOTIDINE 20 MG PO TABS
ORAL_TABLET | ORAL | Status: AC
  Filled 2023-06-28: qty 1

## 2023-06-28 MED ORDER — HYDROCODONE-ACETAMINOPHEN 5-325 MG PO TABS
1.0000 | ORAL_TABLET | Freq: Four times a day (QID) | ORAL | 0 refills | Status: DC | PRN
  Filled 2023-06-28: qty 15, 4d supply, fill #0

## 2023-06-28 MED ORDER — PROPOFOL 10 MG/ML IV BOLUS
INTRAVENOUS | Status: DC | PRN
  Administered 2023-06-28: 120 mg via INTRAVENOUS

## 2023-06-28 MED ORDER — LIDOCAINE HCL (CARDIAC) PF 100 MG/5ML IV SOSY
PREFILLED_SYRINGE | INTRAVENOUS | Status: DC | PRN
  Administered 2023-06-28: 60 mg via INTRAVENOUS

## 2023-06-28 MED ORDER — ACETAMINOPHEN 500 MG PO TABS
1000.0000 mg | ORAL_TABLET | ORAL | Status: AC
  Administered 2023-06-28: 1000 mg via ORAL

## 2023-06-28 MED ORDER — OXYCODONE HCL 5 MG PO TABS
5.0000 mg | ORAL_TABLET | Freq: Once | ORAL | Status: AC | PRN
  Administered 2023-06-28: 5 mg via ORAL

## 2023-06-28 MED ORDER — FENTANYL CITRATE (PF) 100 MCG/2ML IJ SOLN: 25.0000 ug | INTRAMUSCULAR | Status: DC | PRN

## 2023-06-28 MED ORDER — DEXAMETHASONE SODIUM PHOSPHATE 10 MG/ML IJ SOLN
INTRAMUSCULAR | Status: DC | PRN
  Administered 2023-06-28: 10 mg via INTRAVENOUS

## 2023-06-28 MED ORDER — ACETAMINOPHEN 10 MG/ML IV SOLN: 1000.0000 mg | Freq: Once | INTRAVENOUS | Status: DC | PRN

## 2023-06-28 MED ORDER — BUPIVACAINE-EPINEPHRINE (PF) 0.25% -1:200000 IJ SOLN
INTRAMUSCULAR | Status: AC
  Filled 2023-06-28: qty 30

## 2023-06-28 MED ORDER — ROCURONIUM BROMIDE 100 MG/10ML IV SOLN
INTRAVENOUS | Status: DC | PRN
  Administered 2023-06-28: 20 mg via INTRAVENOUS
  Administered 2023-06-28: 40 mg via INTRAVENOUS

## 2023-06-28 MED ORDER — PHENYLEPHRINE 80 MCG/ML (10ML) SYRINGE FOR IV PUSH (FOR BLOOD PRESSURE SUPPORT)
PREFILLED_SYRINGE | INTRAVENOUS | Status: DC | PRN
  Administered 2023-06-28 (×6): 80 ug via INTRAVENOUS

## 2023-06-28 MED ORDER — OXYCODONE HCL 5 MG PO TABS
ORAL_TABLET | ORAL | Status: AC
  Filled 2023-06-28: qty 1

## 2023-06-28 MED ORDER — CHLORHEXIDINE GLUCONATE CLOTH 2 % EX PADS: 6.0000 | MEDICATED_PAD | Freq: Once | CUTANEOUS | Status: DC

## 2023-06-28 MED ORDER — FAMOTIDINE 20 MG PO TABS
20.0000 mg | ORAL_TABLET | Freq: Once | ORAL | Status: AC
  Administered 2023-06-28: 20 mg via ORAL

## 2023-06-28 MED ORDER — CHLORHEXIDINE GLUCONATE 0.12 % MT SOLN
15.0000 mL | Freq: Once | OROMUCOSAL | Status: AC
  Administered 2023-06-28: 15 mL via OROMUCOSAL

## 2023-06-28 MED ORDER — CHLORHEXIDINE GLUCONATE CLOTH 2 % EX PADS
6.0000 | MEDICATED_PAD | Freq: Once | CUTANEOUS | Status: AC
  Administered 2023-06-28: 6 via TOPICAL

## 2023-06-28 MED ORDER — GABAPENTIN 300 MG PO CAPS
ORAL_CAPSULE | ORAL | Status: AC
  Filled 2023-06-28: qty 1

## 2023-06-28 MED ORDER — GABAPENTIN 300 MG PO CAPS
300.0000 mg | ORAL_CAPSULE | ORAL | Status: AC
  Administered 2023-06-28: 300 mg via ORAL

## 2023-06-28 MED ORDER — ONDANSETRON HCL 4 MG/2ML IJ SOLN: 4.0000 mg | Freq: Once | INTRAMUSCULAR | Status: DC | PRN

## 2023-06-28 MED ORDER — CELECOXIB 200 MG PO CAPS
ORAL_CAPSULE | ORAL | Status: AC
  Filled 2023-06-28: qty 1

## 2023-06-28 MED ORDER — FENTANYL CITRATE (PF) 100 MCG/2ML IJ SOLN
INTRAMUSCULAR | Status: AC
  Filled 2023-06-28: qty 2

## 2023-06-28 MED ORDER — MIDAZOLAM HCL 2 MG/2ML IJ SOLN
INTRAMUSCULAR | Status: AC
  Filled 2023-06-28: qty 2

## 2023-06-28 MED ORDER — FENTANYL CITRATE (PF) 100 MCG/2ML IJ SOLN
INTRAMUSCULAR | Status: DC | PRN
  Administered 2023-06-28 (×2): 50 ug via INTRAVENOUS

## 2023-06-28 MED ORDER — MIDAZOLAM HCL 2 MG/2ML IJ SOLN
INTRAMUSCULAR | Status: DC | PRN
  Administered 2023-06-28: 2 mg via INTRAVENOUS

## 2023-06-28 MED ORDER — ONDANSETRON HCL 4 MG/2ML IJ SOLN
INTRAMUSCULAR | Status: DC | PRN
  Administered 2023-06-28: 4 mg via INTRAVENOUS

## 2023-06-28 MED ORDER — ACETAMINOPHEN 500 MG PO TABS
ORAL_TABLET | ORAL | Status: AC
  Filled 2023-06-28: qty 2

## 2023-06-28 MED ORDER — OXYCODONE HCL 5 MG/5ML PO SOLN: 5.0000 mg | Freq: Once | ORAL | Status: AC | PRN

## 2023-06-28 MED ORDER — CEFAZOLIN SODIUM-DEXTROSE 2-4 GM/100ML-% IV SOLN
2.0000 g | INTRAVENOUS | Status: AC
  Administered 2023-06-28: 2 g via INTRAVENOUS

## 2023-06-28 MED ORDER — DEXMEDETOMIDINE HCL IN NACL 80 MCG/20ML IV SOLN
INTRAVENOUS | Status: DC | PRN
  Administered 2023-06-28: 4 ug via INTRAVENOUS

## 2023-06-28 MED ORDER — CEFAZOLIN SODIUM-DEXTROSE 2-4 GM/100ML-% IV SOLN
INTRAVENOUS | Status: AC
  Filled 2023-06-28: qty 100

## 2023-06-28 MED ORDER — CELECOXIB 200 MG PO CAPS
200.0000 mg | ORAL_CAPSULE | ORAL | Status: AC
  Administered 2023-06-28: 200 mg via ORAL

## 2023-06-28 SURGICAL SUPPLY — 43 items
ADH SKN CLS APL DERMABOND .7 (GAUZE/BANDAGES/DRESSINGS) ×2
COUNTER NDL MAGNETIC 40 RED (SET/KITS/TRAYS/PACK) IMPLANT
COUNTER NEEDLE MAGNETIC 40 RED (SET/KITS/TRAYS/PACK) ×2 IMPLANT
COVER TIP SHEARS 8 DVNC (MISCELLANEOUS) ×2 IMPLANT
COVER WAND RF STERILE (DRAPES) ×2 IMPLANT
DERMABOND ADVANCED .7 DNX12 (GAUZE/BANDAGES/DRESSINGS) ×2 IMPLANT
DRAPE ARM DVNC X/XI (DISPOSABLE) ×6 IMPLANT
DRAPE COLUMN DVNC XI (DISPOSABLE) ×2 IMPLANT
DRAPE UTILITY 15X26 TOWEL STRL (DRAPES) ×2 IMPLANT
ELECT REM PT RETURN 9FT ADLT (ELECTROSURGICAL) ×2
ELECTRODE REM PT RTRN 9FT ADLT (ELECTROSURGICAL) ×2 IMPLANT
FORCEPS BPLR R/ABLATION 8 DVNC (INSTRUMENTS) ×2 IMPLANT
GLOVE ORTHO TXT STRL SZ7.5 (GLOVE) ×6 IMPLANT
GOWN STRL REUS W/ TWL LRG LVL3 (GOWN DISPOSABLE) ×2 IMPLANT
GOWN STRL REUS W/ TWL XL LVL3 (GOWN DISPOSABLE) ×4 IMPLANT
GOWN STRL REUS W/TWL LRG LVL3 (GOWN DISPOSABLE) ×10
GOWN STRL REUS W/TWL XL LVL3 (GOWN DISPOSABLE) ×4
KIT PINK PAD W/HEAD ARE REST (MISCELLANEOUS) ×2
KIT PINK PAD W/HEAD ARM REST (MISCELLANEOUS) ×2 IMPLANT
LABEL OR SOLS (LABEL) ×2 IMPLANT
MESH 3DMAX LIGHT 4.1X6.2 RT LR (Mesh General) IMPLANT
NDL DRIVE SUT CUT DVNC (INSTRUMENTS) ×2 IMPLANT
NDL HYPO 22X1.5 SAFETY MO (MISCELLANEOUS) ×2 IMPLANT
NDL INSUFFLATION 14GA 120MM (NEEDLE) IMPLANT
NEEDLE DRIVE SUT CUT DVNC (INSTRUMENTS) ×2 IMPLANT
NEEDLE HYPO 22X1.5 SAFETY MO (MISCELLANEOUS) ×2 IMPLANT
NEEDLE INSUFFLATION 14GA 120MM (NEEDLE) ×2 IMPLANT
PACK LAP CHOLECYSTECTOMY (MISCELLANEOUS) ×2 IMPLANT
SCISSORS MNPLR CVD DVNC XI (INSTRUMENTS) ×2 IMPLANT
SEAL UNIV 5-12 XI (MISCELLANEOUS) ×6 IMPLANT
SET TUBE SMOKE EVAC HIGH FLOW (TUBING) ×2 IMPLANT
SOL ELECTROSURG ANTI STICK (MISCELLANEOUS) ×2
SOLUTION ELECTROSURG ANTI STCK (MISCELLANEOUS) ×2 IMPLANT
SUT MNCRL 4-0 (SUTURE) ×4
SUT MNCRL 4-0 27XMFL (SUTURE) ×4
SUT V-LOC 90 ABS 3-0 VLT V-20 (SUTURE) IMPLANT
SUT VIC AB 2-0 SH 27 (SUTURE) ×2
SUT VIC AB 2-0 SH 27XBRD (SUTURE) ×2 IMPLANT
SUT VIC AB 3-0 SH 27 (SUTURE) ×2
SUT VIC AB 3-0 SH 27X BRD (SUTURE) IMPLANT
SUT VICRYL 0 UR6 27IN ABS (SUTURE) IMPLANT
SUTURE MNCRL 4-0 27XMF (SUTURE) ×2 IMPLANT
TRAP FLUID SMOKE EVACUATOR (MISCELLANEOUS) ×2 IMPLANT

## 2023-06-28 NOTE — Anesthesia Preprocedure Evaluation (Signed)
Anesthesia Evaluation  Patient identified by MRN, date of birth, ID band Patient awake    Reviewed: Allergy & Precautions, NPO status , Patient's Chart, lab work & pertinent test results  History of Anesthesia Complications Negative for: history of anesthetic complications  Airway Mallampati: II  TM Distance: >3 FB Neck ROM: full    Dental no notable dental hx.    Pulmonary neg pulmonary ROS   Pulmonary exam normal        Cardiovascular negative cardio ROS Normal cardiovascular exam     Neuro/Psych  PSYCHIATRIC DISORDERS Anxiety     negative neurological ROS     GI/Hepatic negative GI ROS, Neg liver ROS,,,  Endo/Other  negative endocrine ROS    Renal/GU negative Renal ROS     Musculoskeletal  (+) Arthritis ,    Abdominal   Peds  Hematology negative hematology ROS (+)   Anesthesia Other Findings Past Medical History: No date: Anxiety No date: Arthritis 02/01/2018: Arthritis of hand No date: Hyperlipidemia 10/24/2018: Lateral epicondylitis of right elbow No date: Vitamin D deficiency  Past Surgical History: No date: CESAREAN SECTION No date: COLONOSCOPY No date: COLONOSCOPY W/ POLYPECTOMY No date: TUBAL LIGATION     Reproductive/Obstetrics negative OB ROS                             Anesthesia Physical Anesthesia Plan  ASA: 2  Anesthesia Plan: General ETT   Post-op Pain Management: Toradol IV (intra-op)*, Ofirmev IV (intra-op)* and Dilaudid IV   Induction: Intravenous  PONV Risk Score and Plan: 4 or greater and Ondansetron, Dexamethasone, Midazolam and Treatment may vary due to age or medical condition  Airway Management Planned: Oral ETT  Additional Equipment:   Intra-op Plan:   Post-operative Plan: Extubation in OR  Informed Consent: I have reviewed the patients History and Physical, chart, labs and discussed the procedure including the risks, benefits and  alternatives for the proposed anesthesia with the patient or authorized representative who has indicated his/her understanding and acceptance.     Dental Advisory Given  Plan Discussed with: Anesthesiologist, CRNA and Surgeon  Anesthesia Plan Comments: (Patient consented for risks of anesthesia including but not limited to:  - adverse reactions to medications - damage to eyes, teeth, lips or other oral mucosa - nerve damage due to positioning  - sore throat or hoarseness - Damage to heart, brain, nerves, lungs, other parts of body or loss of life  Patient voiced understanding.)       Anesthesia Quick Evaluation

## 2023-06-28 NOTE — Anesthesia Postprocedure Evaluation (Signed)
Anesthesia Post Note  Patient: [name]  Procedure(s) Performed: XI ROBOTIC ASSISTED INGUINAL HERNIA (Right: Abdomen) INSERTION OF MESH (Right: Inguinal)  Patient location during evaluation: PACU Anesthesia Type: General Level of consciousness: awake and alert Pain management: pain level controlled Vital Signs Assessment: post-procedure vital signs reviewed and stable Respiratory status: spontaneous breathing, nonlabored ventilation, respiratory function stable and patient connected to nasal cannula oxygen Cardiovascular status: blood pressure returned to baseline and stable Postop Assessment: no apparent nausea or vomiting Anesthetic complications: no   No notable events documented.   Last Vitals:  Vitals:   06/28/23 1050 06/28/23 1100  BP:  119/73  Pulse:  75  Resp:  10  Temp:    SpO2: 100% 98%    Last Pain:  Vitals:   06/28/23 1054  TempSrc:   PainSc: 2                  Jennifer Lang

## 2023-06-28 NOTE — Interval H&P Note (Signed)
History and Physical Interval Note:  06/28/2023 9:06 AM  Jennifer Lang  has presented today for surgery, with the diagnosis of inguinal hernia.  The various methods of treatment have been discussed with the patient and family. After consideration of risks, benefits and other options for treatment, the patient has consented to  Procedure(s): XI ROBOTIC ASSISTED INGUINAL HERNIA (Right) as a surgical intervention.  The patient's history has been reviewed, patient examined, no change in status, stable for surgery.  I have reviewed the patient's chart and labs.  Questions were answered to the patient's satisfaction.    The right side is marked.    Campbell Lerner

## 2023-06-28 NOTE — Anesthesia Procedure Notes (Signed)
Procedure Name: Intubation Date/Time: 06/28/2023 9:18 AM  Performed by: Joanette Gula, Milley Vining, CRNAPre-anesthesia Checklist: Patient identified, Emergency Drugs available, Suction available and Patient being monitored Patient Re-evaluated:Patient Re-evaluated prior to induction Oxygen Delivery Method: Circle system utilized Preoxygenation: Pre-oxygenation with 100% oxygen Induction Type: IV induction Ventilation: Mask ventilation without difficulty Laryngoscope Size: McGraph and 3 Grade View: Grade I Tube type: Oral Tube size: 7.0 mm Number of attempts: 1 Airway Equipment and Method: Stylet Placement Confirmation: ETT inserted through vocal cords under direct vision, positive ETCO2 and breath sounds checked- equal and bilateral Secured at: 19 cm Tube secured with: Tape Dental Injury: Teeth and Oropharynx as per pre-operative assessment

## 2023-06-28 NOTE — Op Note (Signed)
Robotic assisted Laparoscopic Transabdominal right inguinal Hernia Repair with Mesh       Pre-operative Diagnosis: Right inguinal Hernia   Post-operative Diagnosis: Same   Procedure: Robotic assisted Laparoscopic  repair of right indirect inguinal hernia(s)   Surgeon: Campbell Lerner, M.D., FACS   Anesthesia: GETA   Findings: Right indirect inguinal hernia, no evidence of left sided hernia.         Procedure Details  The patient was seen again in the Holding Room. The benefits, complications, treatment options, and expected outcomes were discussed with the patient. The risks of bleeding, infection, recurrence of symptoms, failure to resolve symptoms, recurrence of hernia, ischemic orchitis, chronic pain syndrome or neuroma, were reviewed again. The likelihood of improving the patient's symptoms with return to their baseline status is good.  The patient and/or family concurred with the proposed plan, giving informed consent.  The patient was taken to Operating Room, identified  and the procedure verified as Laparoscopic Inguinal Hernia Repair. Laterality confirmed.  A Time Out was held and the above information confirmed.   Prior to the induction of general anesthesia, antibiotic prophylaxis was administered. VTE prophylaxis was in place. General endotracheal anesthesia was then administered and tolerated well. After the induction, the abdomen was prepped with Chloraprep and draped in the sterile fashion. The patient was positioned in the supine position.   After local infiltration of quarter percent Marcaine with epinephrine, stab incision was made left upper quadrant.  On the left at Palmer's point, the Veress needle is passed with sensation of the layers to penetrate the abdominal wall and into the peritoneum.  Saline drop test is confirmed peritoneal placement.  Insufflation is initiated with carbon dioxide to pressures of 15 mmHg. An 8.5 mm port is placed to the left off of the midline,  with blunt tipped trocar.  Pneumoperitoneum maintained w/o HD changes to pressures of 15 mm Hg with CO2. No evidence of bowel injuries.  Two 8.5 mm ports placed under direct vision in each upper quadrant. The laparoscopy revealed right indirect defect(s).   The robot was brought ot the table and docked in the standard fashion, no collision between arms was observed. Instruments were kept under direct view at all times. For right inguinal hernia repair,  I developed a peritoneal flap. The sac(s) were reduced and dissected free from adjacent structures. We preserved the vas and the vessels, and visualized them to their convergence and beyond in the retroperitoneum. Once dissection was completed a large right sided BARD 3D Light mesh was placed and secured at three points with interrupted 2-0 Vicryl to the pubic tubercle and anteriorly. There was good coverage of the direct, indirect and femoral spaces.  Second look revealed no complications or injuries. The flap was then closed with 3-0 V-lock suture.  Peritoneal closure without defects.  Once assuring that hemostasis was adequate, all needles/sponges removed, and the robot was undocked.  The ports were removed, the abdomen desulflated.  4-0 subcuticular Monocryl was used at all skin edges. Dermabond was placed.  Patient tolerated the procedure well. There were no complications. He was taken to the recovery room in stable condition.           Campbell Lerner, M.D., FACS 06/28/2023, 10:42 AM

## 2023-06-28 NOTE — Transfer of Care (Signed)
Immediate Anesthesia Transfer of Care Note  Patient: Jennifer Lang  Procedure(s) Performed: XI ROBOTIC ASSISTED INGUINAL HERNIA (Right: Abdomen) INSERTION OF MESH (Right: Inguinal)  Patient Location: PACU  Anesthesia Type:General  Level of Consciousness: drowsy  Airway & Oxygen Therapy: Patient Spontanous Breathing and Patient connected to face mask oxygen  Post-op Assessment: Report given to RN and Post -op Vital signs reviewed and stable  Post vital signs: Reviewed and stable  Last Vitals:  Vitals Value Taken Time  BP 138/82 06/28/23 1045  Temp 36.4 C 06/28/23 1045  Pulse    Resp 16 06/28/23 1047  SpO2 100 % 06/28/23 1045  Vitals shown include unfiled device data.  Last Pain:  Vitals:   06/28/23 0824  TempSrc: Temporal  PainSc: 0-No pain         Complications: No notable events documented.

## 2023-06-28 NOTE — Discharge Instructions (Signed)

## 2023-06-29 ENCOUNTER — Telehealth: Payer: Self-pay | Admitting: *Deleted

## 2023-06-29 NOTE — Telephone Encounter (Signed)
Faxed FMLA to Tanya Nones at [phone]

## 2023-07-08 ENCOUNTER — Telehealth: Payer: Self-pay | Admitting: Surgery

## 2023-07-08 ENCOUNTER — Ambulatory Visit
Admission: RE | Admit: 2023-07-08 | Discharge: 2023-07-08 | Disposition: A | Discharge status: Home / Self Care | Payer: BC Managed Care – PPO | Source: Ambulatory Visit | Attending: Nurse Practitioner | Admitting: Nurse Practitioner

## 2023-07-08 DIAGNOSIS — Z0000 Encounter for general adult medical examination without abnormal findings: Secondary | ICD-10-CM

## 2023-07-08 DIAGNOSIS — Z1231 Encounter for screening mammogram for malignant neoplasm of breast: Secondary | ICD-10-CM | POA: Insufficient documentation

## 2023-07-08 NOTE — Telephone Encounter (Signed)
Work not sent to [employer] Patient notified.

## 2023-07-08 NOTE — Telephone Encounter (Signed)
Patient states that she needs a work note stating that she can go back to work full time effective immediately.  She had robotic inguinal hernia repair surgery done on 06/28/23 with Dr. Claudine Mouton.  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 There is an area that she can work where they will place her on light duty.  Please call when ready. She will print from her MyChart. Thank you

## 2023-07-13 ENCOUNTER — Encounter: Payer: BC Managed Care – PPO | Admitting: Physician Assistant

## 2023-08-11 ENCOUNTER — Ambulatory Visit: Payer: BC Managed Care – PPO | Admitting: Podiatry

## 2023-08-29 ENCOUNTER — Other Ambulatory Visit: Payer: Self-pay | Admitting: Nurse Practitioner

## 2023-08-29 DIAGNOSIS — A6004 Herpesviral vulvovaginitis: Secondary | ICD-10-CM

## 2023-08-31 ENCOUNTER — Other Ambulatory Visit: Payer: Self-pay

## 2023-08-31 MED ORDER — VALACYCLOVIR HCL 500 MG PO TABS
500.0000 mg | ORAL_TABLET | Freq: Every day | ORAL | 0 refills | Status: DC
Start: 2023-08-31 — End: 2023-11-05
  Filled 2023-08-31 (×2): qty 90, 90d supply, fill #0

## 2023-08-31 NOTE — Telephone Encounter (Signed)
Requested Prescriptions  Pending Prescriptions Disp Refills   valACYclovir (VALTREX) 500 MG tablet 90 tablet 0    Sig: Take 1 tablet (500 mg total) by mouth daily.     Antimicrobials:  Antiviral Agents - Anti-Herpetic Passed - 08/29/2023  7:10 PM      Passed - Valid encounter within last 12 months    Recent Outpatient Visits           2 months ago Non-recurrent unilateral inguinal hernia with obstruction without gangrene   [facility] Berniece Salines, FNP   5 months ago Annual physical exam   [facility] Berniece Salines, FNP   7 months ago Lymphadenopathy   [facility] Berniece Salines, FNP   10 months ago Acute non-recurrent pansinusitis   [facility] Berniece Salines, FNP   1 year ago Annual physical exam   [facility] Berniece Salines, FNP

## 2023-10-18 DIAGNOSIS — L570 Actinic keratosis: Secondary | ICD-10-CM | POA: Diagnosis not present

## 2023-11-05 ENCOUNTER — Other Ambulatory Visit: Payer: Self-pay | Admitting: Nurse Practitioner

## 2023-11-05 DIAGNOSIS — A6004 Herpesviral vulvovaginitis: Secondary | ICD-10-CM

## 2023-11-08 ENCOUNTER — Other Ambulatory Visit: Payer: Self-pay

## 2023-11-08 MED ORDER — VALACYCLOVIR HCL 500 MG PO TABS
500.0000 mg | ORAL_TABLET | Freq: Every day | ORAL | 0 refills | Status: DC
Start: 2023-11-08 — End: 2024-10-17
  Filled 2023-11-08 – 2023-12-06 (×2): qty 90, 90d supply, fill #0

## 2023-11-08 NOTE — Telephone Encounter (Signed)
Requested Prescriptions  Pending Prescriptions Disp Refills   valACYclovir (VALTREX) 500 MG tablet 90 tablet 0    Sig: Take 1 tablet (500 mg total) by mouth daily.     Antimicrobials:  Antiviral Agents - Anti-Herpetic Passed - 11/05/2023 10:04 PM      Passed - Valid encounter within last 12 months    Recent Outpatient Visits           5 months ago Non-recurrent unilateral inguinal hernia with obstruction without gangrene   [facility] Berniece Salines, FNP   7 months ago Annual physical exam   [facility] Berniece Salines, FNP   10 months ago Lymphadenopathy   [facility] Berniece Salines, FNP   1 year ago Acute non-recurrent pansinusitis   [facility] Berniece Salines, FNP   1 year ago Annual physical exam   [facility] Berniece Salines, FNP

## 2023-11-16 ENCOUNTER — Ambulatory Visit: Payer: Self-pay

## 2023-11-16 NOTE — Telephone Encounter (Signed)
  Chief Complaint: Lower right side abdominal pain Symptoms: above - feels like pulling Frequency: 3 weeks Pertinent Negatives: Patient denies Fever redness Disposition: [] ED /[] Urgent Care (no appt availability in office) / [x] Appointment(In office/virtual)/ []  [facility]/ [] Home Care/ [] Refused Recommended Disposition /[] [facility]/ []  Follow-up with PCP Additional Notes: Pt states that she has pain in lower right abdomen, near where recent hernia sx was. Pain is fairly mild and feels like pulling/crampy pain.  Pt unable to take appt available for tomorrow. Pt has made an appt for Monday. Pt will call back if s/s worsen.    Reason for Disposition  [1] MILD pain (e.g., does not interfere with normal activities) AND [2] pain comes and goes (cramps) AND [3] present > 48 hours  (Exception: This same abdominal pain is a chronic symptom recurrent or ongoing AND present > 4 weeks.)  Answer Assessment - Initial Assessment Questions 1. LOCATION: "Where does it hurt?"      Lower right side at abdomen 2. RADIATION: "Does the pain shoot anywhere else?" (e.g., chest, back)     no 3. ONSET: "When did the pain begin?" (e.g., minutes, hours or days ago)      3 weeks ago 4. SUDDEN: "Gradual or sudden onset?"     Gradual 5. PATTERN "Does the pain come and go, or is it constant?"    - If it comes and goes: "How long does it last?" "Do you have pain now?"     (Note: Comes and goes means the pain is intermittent. It goes away completely between bouts.)    - If constant: "Is it getting better, staying the same, or getting worse?"      (Note: Constant means the pain never goes away completely; most serious pain is constant and gets worse.)      Comes and goes 6. SEVERITY: "How bad is the pain?"  (e.g., Scale 1-10; mild, moderate, or severe)    - MILD (1-3): Doesn't interfere with normal activities, abdomen soft and not tender to touch.     - MODERATE (4-7): Interferes with normal  activities or awakens from sleep, abdomen tender to touch.     - SEVERE (8-10): Excruciating pain, doubled over, unable to do any normal activities.       Mild - pulling crampy pain 7. RECURRENT SYMPTOM: "Have you ever had this type of stomach pain before?" If Yes, ask: "When was the last time?" and "What happened that time?"      no 8. CAUSE: "What do you think is causing the stomach pain?"     Hernia Mesh 9. RELIEVING/AGGRAVATING FACTORS: "What makes it better or worse?" (e.g., antacids, bending or twisting motion, bowel movement)     *No Answer* 10. OTHER SYMPTOMS: "Do you have any other symptoms?" (e.g., back pain, diarrhea, fever, urination pain, vomiting)       *No Answer*  Protocols used: Abdominal Pain - Female-A-AH

## 2023-11-17 ENCOUNTER — Ambulatory Visit: Payer: BC Managed Care – PPO | Admitting: Physician Assistant

## 2023-11-22 ENCOUNTER — Other Ambulatory Visit (HOSPITAL_COMMUNITY)
Admission: RE | Admit: 2023-11-22 | Discharge: 2023-11-22 | Disposition: A | Discharge status: Home / Self Care | Payer: BC Managed Care – PPO | Source: Ambulatory Visit | Attending: Nurse Practitioner | Admitting: Nurse Practitioner

## 2023-11-22 ENCOUNTER — Telehealth: Payer: Self-pay

## 2023-11-22 ENCOUNTER — Encounter: Payer: Self-pay | Admitting: Nurse Practitioner

## 2023-11-22 ENCOUNTER — Ambulatory Visit
Admission: RE | Admit: 2023-11-22 | Discharge: 2023-11-22 | Disposition: A | Discharge status: Home / Self Care | Payer: BC Managed Care – PPO | Source: Ambulatory Visit | Attending: Nurse Practitioner | Admitting: Nurse Practitioner

## 2023-11-22 ENCOUNTER — Ambulatory Visit (INDEPENDENT_AMBULATORY_CARE_PROVIDER_SITE_OTHER): Payer: BC Managed Care – PPO | Admitting: Nurse Practitioner

## 2023-11-22 ENCOUNTER — Other Ambulatory Visit: Payer: Self-pay | Admitting: Nurse Practitioner

## 2023-11-22 VITALS — BP 118/72 | HR 92 | Temp 98.1°F | Resp 12 | Ht 62.0 in | Wt 126.2 lb

## 2023-11-22 DIAGNOSIS — R1031 Right lower quadrant pain: Secondary | ICD-10-CM

## 2023-11-22 DIAGNOSIS — R9389 Abnormal findings on diagnostic imaging of other specified body structures: Secondary | ICD-10-CM | POA: Insufficient documentation

## 2023-11-22 DIAGNOSIS — R102 Pelvic and perineal pain: Secondary | ICD-10-CM | POA: Insufficient documentation

## 2023-11-22 DIAGNOSIS — N888 Other specified noninflammatory disorders of cervix uteri: Secondary | ICD-10-CM | POA: Diagnosis not present

## 2023-11-22 DIAGNOSIS — K5730 Diverticulosis of large intestine without perforation or abscess without bleeding: Secondary | ICD-10-CM | POA: Diagnosis not present

## 2023-11-22 LAB — POCT URINALYSIS DIPSTICK
Appearance: NORMAL
Bilirubin, UA: NEGATIVE
Glucose, UA: NEGATIVE
Ketones, UA: NEGATIVE
Leukocytes, UA: NEGATIVE
Nitrite, UA: NEGATIVE
Protein, UA: NEGATIVE
Spec Grav, UA: 1.01 (ref 1.010–1.025)
Urobilinogen, UA: 0.2 U/dL
pH, UA: 7 (ref 5.0–8.0)

## 2023-11-22 MED ORDER — GADOBUTROL 1 MMOL/ML IV SOLN
6.0000 mL | Freq: Once | INTRAVENOUS | Status: AC | PRN
  Administered 2023-11-22: 6 mL via INTRAVENOUS

## 2023-11-22 NOTE — Progress Notes (Signed)
BP 118/72 (BP Location: Right Arm, Patient Position: Sitting, Cuff Size: Normal)   Pulse 92   Temp 98.1 F (36.7 C) (Oral)   Resp 12   Ht 5\' 2"  (1.575 m)   Wt 126 lb 3.2 oz (57.2 kg)   SpO2 94%   BMI 23.08 kg/m    Subjective:    Patient ID: Jennifer Lang, female    DOB: 22-Aug-1961, 62 y.o.   MRN: [id number]  HPI: Jennifer Lang is a 62 y.o. female  Chief Complaint  Patient presents with   Abdominal Pain    Lower right quadrant/sporadic   vaginal discomfort    "Feels like vagina is falling out"    Discussed the use of AI scribe software for clinical note transcription with the patient, who gave verbal consent to proceed.  History of Present Illness   The patient presents with intermittent right lower quadrant abdominal pain and vaginal discomfort for the past three weeks. The abdominal pain is described as a 'pulling or pinching from the inside,' with no identifiable triggers or alleviating factors. She denies associated diarrhea, constipation, nausea, vomiting, or changes in bowel habits. She has a history of right-sided hernia repair in July.  In addition to the abdominal pain, she also reports a sensation of her 'vagina falling out' and irritation, likened to a yeast infection. She denies dysuria, fever, or abnormal vaginal discharge. She reports incomplete voiding. She has not been sexually active for the past month, but when she was, she experienced mild discomfort during intercourse.    Relevant past medical, surgical, family and social history reviewed and updated as indicated. Interim medical history since our last visit reviewed. Allergies and medications reviewed and updated.  Review of Systems  Ten systems reviewed and is negative except as mentioned in HPI       Objective:    BP 118/72 (BP Location: Right Arm, Patient Position: Sitting, Cuff Size: Normal)   Pulse 92   Temp 98.1 F (36.7 C) (Oral)   Resp 12   Ht 5\' 2"  (1.575 m)   Wt 126 lb 3.2 oz (57.2 kg)    SpO2 94%   BMI 23.08 kg/m   Wt Readings from Last 3 Encounters:  11/22/23 126 lb 3.2 oz (57.2 kg)  06/28/23 123 lb (55.8 kg)  06/22/23 122 lb (55.3 kg)    Physical Exam  Constitutional: Patient appears well-developed and well-nourished.  No distress.  HEENT: head atraumatic, normocephalic, pupils equal and reactive to light, neck supple Cardiovascular: Normal rate, regular rhythm and normal heart sounds.  No murmur heard. No BLE edema. Pulmonary/Chest: Effort normal and breath sounds normal. No respiratory distress. Abdominal: Soft.  Tenderness lower abdomen,  right more than left Psychiatric: Patient has a normal mood and affect. behavior is normal. Judgment and thought content normal.  Pelvic exam: normal external genitalia, vulva, vagina, cervix, uterus and adnexa.  Results for orders placed or performed in visit on 11/22/23  POCT urinalysis dipstick  Result Value Ref Range   Color, UA Pale yellow    Clarity, UA Clear    Glucose, UA Negative Negative   Bilirubin, UA Negative    Ketones, UA Negative    Spec Grav, UA 1.010 1.010 - 1.025   Blood, UA [place]    pH, UA 7.0 5.0 - 8.0   Protein, UA Negative Negative   Urobilinogen, UA 0.2 0.2 or 1.0 E.U./dL   Nitrite, UA Negatice    Leukocytes, UA Negative Negative   Appearance Normal  Odor No       Assessment & Plan:   Problem List Items Addressed This Visit   None Visit Diagnoses     Pelvic pain    -  Primary   Relevant Orders   US PELVIC COMPLETE WITH TRANSVAGINAL   CBC with Differential/Platelet   COMPLETE METABOLIC PANEL WITH GFR   Cervicovaginal ancillary only   POCT urinalysis dipstick (Completed)   Right lower quadrant pain       Relevant Orders   CBC with Differential/Platelet   COMPLETE METABOLIC PANEL WITH GFR   Cervicovaginal ancillary only   POCT urinalysis dipstick (Completed)       Assessment and Plan    Right Lower Quadrant Pain Intermittent for 3 weeks, described as a pulling sensation.  No exacerbating or relieving factors identified. No associated GI symptoms. History of right-sided hernia repair in July. Differential includes ovarian cyst, adhesions from prior surgery, or hernia recurrence. -Order pelvic ultrasound to evaluate for ovarian pathology. -If pain worsens, advise patient to present to the emergency department for further evaluation.  Vaginal Discomfort Described as feeling like "vagina is falling out" and irritation. No dysuria, fever, or significant discharge. Mild discomfort with intercourse. Normal pelvic exam. -Obtained vaginal swab to rule out infection. -Order urine analysis to rule out urinary tract infection.        Follow up plan: Return if symptoms worsen or fail to improve.

## 2023-11-22 NOTE — Telephone Encounter (Signed)
Authorized  Order ID: [id number]   Approval Valid Through: 11/22/2023 - 12/21/2023   STAT MRI ordered, contacted MRI scheduling and they are going to call patient and schedule with her.

## 2023-11-23 LAB — CBC WITH DIFFERENTIAL/PLATELET
Absolute Lymphocytes: 2162 {cells}/uL (ref 850–3900)
Absolute Monocytes: 469 {cells}/uL (ref 200–950)
Basophils Absolute: 41 {cells}/uL (ref 0–200)
Basophils Relative: 0.6 %
Eosinophils Absolute: 82 {cells}/uL (ref 15–500)
Eosinophils Relative: 1.2 %
HCT: 41.9 % (ref 35.0–45.0)
Hemoglobin: 14.1 g/dL (ref 11.7–15.5)
MCH: 30.1 pg (ref 27.0–33.0)
MCHC: 33.7 g/dL (ref 32.0–36.0)
MCV: 89.3 fL (ref 80.0–100.0)
MPV: 9.8 fL (ref 7.5–12.5)
Monocytes Relative: 6.9 %
Neutro Abs: 4046 {cells}/uL (ref 1500–7800)
Neutrophils Relative %: 59.5 %
Platelets: 282 10*3/uL (ref 140–400)
RBC: 4.69 10*6/uL (ref 3.80–5.10)
RDW: 13.2 % (ref 11.0–15.0)
Total Lymphocyte: 31.8 %
WBC: 6.8 10*3/uL (ref 3.8–10.8)

## 2023-11-23 LAB — COMPLETE METABOLIC PANEL WITH GFR
AG Ratio: 1.5 (calc) (ref 1.0–2.5)
ALT: 13 U/L (ref 6–29)
AST: 20 U/L (ref 10–35)
Albumin: 4.6 g/dL (ref 3.6–5.1)
Alkaline phosphatase (APISO): 111 U/L (ref 37–153)
BUN: 15 mg/dL (ref 7–25)
CO2: 27 mmol/L (ref 20–32)
Calcium: 10.2 mg/dL (ref 8.6–10.4)
Chloride: 104 mmol/L (ref 98–110)
Creat: 0.67 mg/dL (ref 0.50–1.05)
Globulin: 3 g/dL (ref 1.9–3.7)
Glucose, Bld: 91 mg/dL (ref 65–99)
Potassium: 4.6 mmol/L (ref 3.5–5.3)
Sodium: 141 mmol/L (ref 135–146)
Total Bilirubin: 0.3 mg/dL (ref 0.2–1.2)
Total Protein: 7.6 g/dL (ref 6.1–8.1)
eGFR: 98 mL/min/{1.73_m2} (ref >=60)

## 2023-11-24 LAB — CERVICOVAGINAL ANCILLARY ONLY
Bacterial Vaginitis (gardnerella): NEGATIVE
Candida Glabrata: NEGATIVE
Candida Vaginitis: NEGATIVE
Chlamydia: NEGATIVE
Comment: NEGATIVE
Comment: NEGATIVE
Comment: NEGATIVE
Comment: NEGATIVE
Comment: NEGATIVE
Comment: NORMAL
Neisseria Gonorrhea: NEGATIVE
Trichomonas: NEGATIVE

## 2023-12-06 ENCOUNTER — Other Ambulatory Visit: Payer: Self-pay

## 2024-02-23 ENCOUNTER — Other Ambulatory Visit: Payer: Self-pay

## 2024-02-24 ENCOUNTER — Other Ambulatory Visit: Payer: Self-pay

## 2024-02-24 MED ORDER — FLUTICASONE PROPIONATE 50 MCG/ACT NA SUSP
2.0000 | Freq: Every day | NASAL | 11 refills | Status: AC
  Filled 2024-02-24: qty 16, 30d supply, fill #0
  Filled 2024-04-18: qty 16, 30d supply, fill #1
  Filled 2024-10-17: qty 16, 30d supply, fill #2
  Filled 2024-11-13: qty 16, 30d supply, fill #3

## 2024-02-29 ENCOUNTER — Other Ambulatory Visit: Payer: Self-pay

## 2024-03-01 ENCOUNTER — Ambulatory Visit (INDEPENDENT_AMBULATORY_CARE_PROVIDER_SITE_OTHER): Admitting: Nurse Practitioner

## 2024-03-01 ENCOUNTER — Encounter: Payer: Self-pay | Admitting: Nurse Practitioner

## 2024-03-01 VITALS — BP 104/60 | HR 79 | Temp 98.0°F | Resp 18 | Ht 62.0 in | Wt 123.7 lb

## 2024-03-01 DIAGNOSIS — Z0000 Encounter for general adult medical examination without abnormal findings: Secondary | ICD-10-CM | POA: Diagnosis not present

## 2024-03-01 DIAGNOSIS — Z1231 Encounter for screening mammogram for malignant neoplasm of breast: Secondary | ICD-10-CM

## 2024-03-01 DIAGNOSIS — Z130 Encounter for screening for diseases of the blood and blood-forming organs and certain disorders involving the immune mechanism: Secondary | ICD-10-CM | POA: Diagnosis not present

## 2024-03-01 DIAGNOSIS — Z131 Encounter for screening for diabetes mellitus: Secondary | ICD-10-CM | POA: Diagnosis not present

## 2024-03-01 DIAGNOSIS — E785 Hyperlipidemia, unspecified: Secondary | ICD-10-CM | POA: Diagnosis not present

## 2024-03-01 NOTE — Progress Notes (Signed)
 Name: Jennifer Lang   MRN: [id number]    DOB: 07/01/1961   Date:03/01/2024       Progress Note  Subjective  Chief Complaint  Chief Complaint  Patient presents with   Annual Exam    HPI  Patient presents for annual CPE.  Diet: well balanced diet, incorporates plant protein instead of meat sometimes. Exercise: hikes and does yard work 2-3 times a week   Sleep: 8-9 hrs a night Last dental exam: November 2025 Last eye exam: needs to schedule an exam  Health Maintenance  Topic Date Due   Zoster (Shingles) Vaccine (1 of 2) Never done   Flu Shot  03/13/2024*   COVID-19 Vaccine (3 - 2024-25 season) 03/17/2024*   Mammogram  07/07/2024   Cologuard (Stool DNA test)  03/21/2026   Pap with HPV screening  03/15/2028   Hepatitis C Screening  Completed   HIV Screening  Completed   HPV Vaccine  Aged Out   DTaP/Tdap/Td vaccine  Discontinued   Colon Cancer Screening  Discontinued  *Topic was postponed. The date shown is not the original due date.     Flowsheet Row Office Visit from 03/01/2024 in [facility]  AUDIT-C Score 2       Depression: Phq 9 is  negative    03/01/2024    8:43 AM 11/22/2023   11:28 AM 06/08/2023   11:09 AM 03/16/2023    1:40 PM 01/06/2023    8:47 AM  Depression screen PHQ 2/9  Decreased Interest 0 0 0 0 0  Down, Depressed, Hopeless 0 0 0 0 0  PHQ - 2 Score 0 0 0 0 0  Altered sleeping 1 0     Tired, decreased energy 0 0     Change in appetite 0 0     Feeling bad or failure about yourself  0 0     Trouble concentrating 0 0     Moving slowly or fidgety/restless 0 0     Suicidal thoughts 0 0     PHQ-9 Score 1 0     Difficult doing work/chores Not difficult at all       Hypertension: BP Readings from Last 3 Encounters:  03/01/24 104/60  11/22/23 118/72  06/28/23 119/71   Obesity: Wt Readings from Last 3 Encounters:  03/01/24 123 lb 11.2 oz (56.1 kg)  11/22/23 126 lb 3.2 oz (57.2 kg)  06/28/23 123 lb (55.8 kg)   BMI Readings  from Last 3 Encounters:  03/01/24 22.63 kg/m  11/22/23 23.08 kg/m  06/28/23 22.50 kg/m     Vaccines:  HPV: up to at age 75 , ask insurance if age between 37-45  Shingrix: 66-64 yo and ask insurance if covered when patient above 77 yo Pneumonia:  educated and discussed with patient. Flu:  educated and discussed with patient.  Hep C Screening: completed STD testing and prevention (HIV/chl/gon/syphilis): completed Intimate partner violence:none Sexual History :sexually active, same partner Menstrual History/LMP/Abnormal Bleeding: postmenopausal  Incontinence Symptoms: none  Breast cancer:  - Last Mammogram: 07/08/2023, order placed for this year - BRCA gene screening: none  Osteoporosis: Discussed high calcium and vitamin D supplementation, weight bearing exercises  Cervical cancer screening: 03/2023  Skin cancer: Discussed monitoring for atypical lesions  Colorectal cancer: 03/2023 Lung cancer:   Low Dose CT Chest recommended if Age 70-80 years, 20 pack-year currently smoking OR have quit w/in 15years. Patient does not qualify.   ECG: none  Advanced Care Planning: A voluntary discussion about  advance care planning including the explanation and discussion of advance directives.  Discussed health care proxy and Living will, and the patient was able to identify a health care proxy as Sister.  Patient does not have a living will at present time. If patient does have living will, I have requested they bring this to the clinic to be scanned in to their chart.  Lipids: Lab Results  Component Value Date   CHOL 215 (H) 03/16/2023   CHOL 212 (H) 01/14/2022   CHOL 212 (H) 04/24/2021   Lab Results  Component Value Date   HDL 57 03/16/2023   HDL 48 (L) 01/14/2022   HDL 52 04/24/2021   Lab Results  Component Value Date   LDLCALC 133 (H) 03/16/2023   LDLCALC 128 (H) 01/14/2022   LDLCALC 124 (H) 04/24/2021   Lab Results  Component Value Date   TRIG 134 03/16/2023   TRIG 221 (H)  01/14/2022   TRIG 216 (H) 04/24/2021   Lab Results  Component Value Date   CHOLHDL 3.8 03/16/2023   CHOLHDL 4.4 01/14/2022   CHOLHDL 4.1 04/24/2021   No results found for: "LDLDIRECT"  Glucose: Glucose, Bld  Date Value Ref Range Status  11/22/2023 91 65 - 99 mg/dL Final    Comment:    .            Fasting reference interval .   03/16/2023 90 65 - 99 mg/dL Final    Comment:    .            Fasting reference interval .   01/14/2022 109 (H) 65 - 99 mg/dL Final    Comment:    .            Fasting reference interval . For someone without known diabetes, a glucose value between 100 and 125 mg/dL is consistent with prediabetes and should be confirmed with a follow-up test. .     Patient Active Problem List   Diagnosis Date Noted   Right inguinal hernia 06/28/2023   Atrophic vaginitis 04/24/2021   Hyperlipidemia 02/01/2018    Past Surgical History:  Procedure Laterality Date   CESAREAN SECTION     COLONOSCOPY     COLONOSCOPY W/ POLYPECTOMY     HERNIA REPAIR     INSERTION OF MESH Right 06/28/2023   Procedure: INSERTION OF MESH;  Surgeon: Campbell Lerner, MD;  Location: [facility] ORS;  Service: General;  Laterality: Right;   TUBAL LIGATION      Family History  Problem Relation Age of Onset   Arthritis Mother    Alzheimer's disease Mother    Hyperlipidemia Mother    Breast cancer Mother        43's   Hypertension Father    Breast cancer Paternal Aunt        Passed away in 65's from Breast CA   Hyperlipidemia Sister    Bladder Cancer Brother    Prostate cancer Brother    Kidney cancer Brother    Stroke Maternal Grandmother    Uterine cancer Paternal Grandmother     Social History   Socioeconomic History   Marital status: Single    Spouse name: Not on file   Number of children: 3   Years of education: Not on file   Highest education level: Associate degree: occupational, Scientist, product/process development, or vocational program  Occupational History   Not on file  Tobacco  Use   Smoking status: Never    Passive exposure: Never   Smokeless  tobacco: Never  Vaping Use   Vaping status: Never Used  Substance and Sexual Activity   Alcohol use: Yes    Alcohol/week: 1.0 standard drink of alcohol    Types: 1 Glasses of wine per week   Drug use: No   Sexual activity: Not Currently    Partners: Male    Birth control/protection: Post-menopausal  Other Topics Concern   Not on file  Social History Narrative   Lives alone.  Moved from [place] in August 2018. Works at [employer] at [employer] in [place].   Social Drivers of Corporate investment banker Strain: Low Risk  (02/26/2024)   Overall Financial Resource Strain (CARDIA)    Difficulty of Paying Living Expenses: Not very hard  Food Insecurity: No Food Insecurity (02/26/2024)   Hunger Vital Sign    Worried About Running Out of Food in the Last Year: Never true    Ran Out of Food in the Last Year: Never true  Transportation Needs: No Transportation Needs (02/26/2024)   PRAPARE - Administrator, Civil Service (Medical): No    Lack of Transportation (Non-Medical): No  Physical Activity: Sufficiently Active (02/26/2024)   Exercise Vital Sign    Days of Exercise per Week: 4 days    Minutes of Exercise per Session: 60 min  Stress: Stress Concern Present (03/01/2024)   [employer] of Occupational Health - Occupational Stress Questionnaire    Feeling of Stress : To some extent  Social Connections: Socially Isolated (03/01/2024)   Social Connection and Isolation Panel [NHANES]    Frequency of Communication with Friends and Family: More than three times a week    Frequency of Social Gatherings with Friends and Family: Twice a week    Attends Religious Services: Never    Database administrator or Organizations: No    Attends Banker Meetings: Never    Marital Status: Divorced  Catering manager Violence: Not At Risk (03/16/2023)   Humiliation, Afraid, Rape, and Kick questionnaire     Fear of Current or Ex-Partner: No    Emotionally Abused: No    Physically Abused: No    Sexually Abused: No     Current Outpatient Medications:    cetirizine (ZYRTEC) 10 MG tablet, Take 10 mg by mouth daily., Disp: , Rfl:    cholecalciferol (VITAMIN D) 400 units TABS tablet, Take 400 Units by mouth., Disp: , Rfl:    diphenhydrAMINE HCl (BENADRYL ALLERGY PO), Take by mouth as needed., Disp: , Rfl:    fluticasone (FLONASE) 50 MCG/ACT nasal spray, Place 2 sprays into both nostrils daily., Disp: 16 g, Rfl: 11   ibuprofen (ADVIL) 200 MG tablet, Take 200 mg by mouth every 6 (six) hours as needed., Disp: , Rfl:    Multiple Vitamin (MULTIVITAMIN) tablet, Take 1 tablet by mouth daily., Disp: , Rfl:    psyllium (METAMUCIL) 58.6 % packet, Take 1 packet by mouth daily., Disp: , Rfl:    valACYclovir (VALTREX) 500 MG tablet, Take 1 tablet (500 mg total) by mouth daily., Disp: 90 tablet, Rfl: 0  Allergies  Allergen Reactions   Iodinated Contrast Media Hives    Pt states she had a CT Scan and before the scan was completed she developed hives and was given Benadryl within a few minutes of the scan.      ROS  Constitutional: Negative for fever or weight change.  Respiratory: Negative for cough and shortness of breath.   Cardiovascular: Negative for chest pain or  palpitations.  Gastrointestinal: Negative for abdominal pain, no bowel changes.  Musculoskeletal: Negative for gait problem or joint swelling.  Skin: Negative for rash.  Neurological: Negative for dizziness or headache.  No other specific complaints in a complete review of systems (except as listed in HPI above).   Objective  Vitals:   03/01/24 0831  BP: 104/60  Pulse: 79  Resp: 18  Temp: 98 F (36.7 C)  SpO2: 98%  Weight: 123 lb 11.2 oz (56.1 kg)  Height: 5\' 2"  (1.575 m)    Body mass index is 22.63 kg/m.  Physical Exam Vitals reviewed.  Constitutional:      Appearance: Normal appearance.  HENT:     Head: Normocephalic.      Right Ear: Tympanic membrane normal.     Left Ear: Tympanic membrane normal.     Nose: Nose normal.  Eyes:     Extraocular Movements: Extraocular movements intact.     Conjunctiva/sclera: Conjunctivae normal.     Pupils: Pupils are equal, round, and reactive to light.  Neck:     Thyroid: No thyroid mass, thyromegaly or thyroid tenderness.  Cardiovascular:     Rate and Rhythm: Normal rate and regular rhythm.     Pulses: Normal pulses.     Heart sounds: Normal heart sounds.  Pulmonary:     Effort: Pulmonary effort is normal.     Breath sounds: Normal breath sounds.  Abdominal:     General: Bowel sounds are normal.     Palpations: Abdomen is soft.  Musculoskeletal:        General: Normal range of motion.     Cervical back: Normal range of motion and neck supple.     Right lower leg: No edema.     Left lower leg: No edema.  Skin:    General: Skin is warm and dry.     Capillary Refill: Capillary refill takes less than 2 seconds.  Neurological:     General: No focal deficit present.     Mental Status: She is alert and oriented to person, place, and time. Mental status is at baseline.  Psychiatric:        Mood and Affect: Mood normal.        Behavior: Behavior normal.        Thought Content: Thought content normal.        Judgment: Judgment normal.       Fall Risk:    03/01/2024    8:36 AM 11/22/2023   11:28 AM 06/22/2023   11:20 AM 06/08/2023   11:09 AM 03/16/2023    1:40 PM  Fall Risk   Falls in the past year? 0 0 0 0 0  Number falls in past yr: 0  0 0 0  Injury with Fall? 0  0 0 0  Risk for fall due to :  No Fall Risks     Follow up Falls evaluation completed Falls prevention discussed        Functional Status Survey: Is the patient deaf or have difficulty hearing?: No Does the patient have difficulty seeing, even when wearing glasses/contacts?: No Does the patient have difficulty concentrating, remembering, or making decisions?: No Does the patient have  difficulty walking or climbing stairs?: No Does the patient have difficulty dressing or bathing?: No Does the patient have difficulty doing errands alone such as visiting a doctor's office or shopping?: No   Assessment & Plan  1. Annual physical exam (Primary)  - CBC with Differential/Platelet - COMPLETE  METABOLIC PANEL WITH GFR - Lipid panel - Hemoglobin A1c  2. Hyperlipidemia, unspecified hyperlipidemia type  - Lipid panel  3. Screening for deficiency anemia  - CBC with Differential/Platelet  4. Screening for diabetes mellitus  - COMPLETE METABOLIC PANEL WITH GFR - Hemoglobin A1c  5. Encounter for screening mammogram for malignant neoplasm of breast  - MM 3D SCREENING MAMMOGRAM BILATERAL BREAST; Future   -USPSTF grade A and B recommendations reviewed with patient; age-appropriate recommendations, preventive care, screening tests, etc discussed and encouraged; healthy living encouraged; see AVS for patient education given to patient -Discussed importance of 150 minutes of physical activity weekly, eat two servings of fish weekly, eat one serving of tree nuts ( cashews, pistachios, pecans, almonds.Marland Kitchen) every other day, eat 6 servings of fruit/vegetables daily and drink plenty of water and avoid sweet beverages.   -Reviewed Health Maintenance: yes

## 2024-03-02 ENCOUNTER — Encounter: Payer: Self-pay | Admitting: Nurse Practitioner

## 2024-03-02 LAB — CBC WITH DIFFERENTIAL/PLATELET
Absolute Lymphocytes: 2095 {cells}/uL (ref 850–3900)
Absolute Monocytes: 478 {cells}/uL (ref 200–950)
Basophils Absolute: 59 {cells}/uL (ref 0–200)
Basophils Relative: 1 %
Eosinophils Absolute: 83 {cells}/uL (ref 15–500)
Eosinophils Relative: 1.4 %
HCT: 40 % (ref 35.0–45.0)
Hemoglobin: 13.2 g/dL (ref 11.7–15.5)
MCH: 29.6 pg (ref 27.0–33.0)
MCHC: 33 g/dL (ref 32.0–36.0)
MCV: 89.7 fL (ref 80.0–100.0)
MPV: 9.9 fL (ref 7.5–12.5)
Monocytes Relative: 8.1 %
Neutro Abs: 3186 {cells}/uL (ref 1500–7800)
Neutrophils Relative %: 54 %
Platelets: 248 10*3/uL (ref 140–400)
RBC: 4.46 10*6/uL (ref 3.80–5.10)
RDW: 13.5 % (ref 11.0–15.0)
Total Lymphocyte: 35.5 %
WBC: 5.9 10*3/uL (ref 3.8–10.8)

## 2024-03-02 LAB — COMPLETE METABOLIC PANEL WITH GFR
AG Ratio: 1.9 (calc) (ref 1.0–2.5)
ALT: 15 U/L (ref 6–29)
AST: 24 U/L (ref 10–35)
Albumin: 4.5 g/dL (ref 3.6–5.1)
Alkaline phosphatase (APISO): 100 U/L (ref 37–153)
BUN: 14 mg/dL (ref 7–25)
CO2: 30 mmol/L (ref 20–32)
Calcium: 9.7 mg/dL (ref 8.6–10.4)
Chloride: 105 mmol/L (ref 98–110)
Creat: 0.64 mg/dL (ref 0.50–1.05)
Globulin: 2.4 g/dL (ref 1.9–3.7)
Glucose, Bld: 83 mg/dL (ref 65–99)
Potassium: 4.5 mmol/L (ref 3.5–5.3)
Sodium: 141 mmol/L (ref 135–146)
Total Bilirubin: 0.5 mg/dL (ref 0.2–1.2)
Total Protein: 6.9 g/dL (ref 6.1–8.1)

## 2024-03-02 LAB — HEMOGLOBIN A1C
Hgb A1c MFr Bld: 5.5 %{Hb} (ref <5.7)
Mean Plasma Glucose: 111 mg/dL
eAG (mmol/L): 6.2 mmol/L

## 2024-03-02 LAB — LIPID PANEL
Cholesterol: 229 mg/dL — ABNORMAL HIGH (ref <200)
HDL: 52 mg/dL (ref >=50)
LDL Cholesterol (Calc): 145 mg/dL — ABNORMAL HIGH
Non-HDL Cholesterol (Calc): 177 mg/dL — ABNORMAL HIGH (ref <130)
Total CHOL/HDL Ratio: 4.4 (calc) (ref <5.0)
Triglycerides: 187 mg/dL — ABNORMAL HIGH (ref <150)

## 2024-04-23 ENCOUNTER — Encounter: Payer: Self-pay | Admitting: Nurse Practitioner

## 2024-04-24 NOTE — Progress Notes (Signed)
 There were no vitals taken for this visit.   Subjective:    Patient ID: Storee Vettel, female    DOB: 1961/01/20, 63 y.o.   MRN: [id number]  HPI: Callyn Krick is a 63 y.o. female  No chief complaint on file.   Discussed the use of AI scribe software for clinical note transcription with the patient, who gave verbal consent to proceed.  History of Present Illness          03/01/2024    8:43 AM 11/22/2023   11:28 AM 06/08/2023   11:09 AM  Depression screen PHQ 2/9  Decreased Interest 0 0 0  Down, Depressed, Hopeless 0 0 0  PHQ - 2 Score 0 0 0  Altered sleeping 1 0   Tired, decreased energy 0 0   Change in appetite 0 0   Feeling bad or failure about yourself  0 0   Trouble concentrating 0 0   Moving slowly or fidgety/restless 0 0   Suicidal thoughts 0 0   PHQ-9 Score 1 0   Difficult doing work/chores Not difficult at all      Relevant past medical, surgical, family and social history reviewed and updated as indicated. Interim medical history since our last visit reviewed. Allergies and medications reviewed and updated.  Review of Systems  Per HPI unless specifically indicated above     Objective:      There were no vitals taken for this visit.  {Vitals History (Optional):23777} Wt Readings from Last 3 Encounters:  03/01/24 123 lb 11.2 oz (56.1 kg)  11/22/23 126 lb 3.2 oz (57.2 kg)  06/28/23 123 lb (55.8 kg)    Physical Exam Physical Exam    Results for orders placed or performed in visit on 03/01/24  CBC with Differential/Platelet   Collection Time: 03/01/24  9:17 AM  Result Value Ref Range   WBC 5.9 3.8 - 10.8 Thousand/uL   RBC 4.46 3.80 - 5.10 Million/uL   Hemoglobin 13.2 11.7 - 15.5 g/dL   HCT 82.9 56.2 - 13.0 %   MCV 89.7 80.0 - 100.0 fL   MCH 29.6 27.0 - 33.0 pg   MCHC 33.0 32.0 - 36.0 g/dL   RDW 86.5 78.4 - 69.6 %   Platelets 248 140 - 400 Thousand/uL   MPV 9.9 7.5 - 12.5 fL   Neutro Abs 3,186 1,500 - 7,800 cells/uL   Absolute Lymphocytes  2,095 850 - 3,900 cells/uL   Absolute Monocytes 478 200 - 950 cells/uL   Eosinophils Absolute 83 15 - 500 cells/uL   Basophils Absolute 59 0 - 200 cells/uL   Neutrophils Relative % 54 %   Total Lymphocyte 35.5 %   Monocytes Relative 8.1 %   Eosinophils Relative 1.4 %   Basophils Relative 1.0 %  COMPLETE METABOLIC PANEL WITH GFR   Collection Time: 03/01/24  9:17 AM  Result Value Ref Range   Glucose, Bld 83 65 - 99 mg/dL   BUN 14 7 - 25 mg/dL   Creat 2.95 2.84 - 1.32 mg/dL   BUN/Creatinine Ratio SEE NOTE: 6 - 22 (calc)   Sodium 141 135 - 146 mmol/L   Potassium 4.5 3.5 - 5.3 mmol/L   Chloride 105 98 - 110 mmol/L   CO2 30 20 - 32 mmol/L   Calcium 9.7 8.6 - 10.4 mg/dL   Total Protein 6.9 6.1 - 8.1 g/dL   Albumin 4.5 3.6 - 5.1 g/dL   Globulin 2.4 1.9 - 3.7 g/dL (calc)   AG Ratio  1.9 1.0 - 2.5 (calc)   Total Bilirubin 0.5 0.2 - 1.2 mg/dL   Alkaline phosphatase (APISO) 100 37 - 153 U/L   AST 24 10 - 35 U/L   ALT 15 6 - 29 U/L  Lipid panel   Collection Time: 03/01/24  9:17 AM  Result Value Ref Range   Cholesterol 229 (H) <200 mg/dL   HDL 52 > OR = 50 mg/dL   Triglycerides 865 (H) <150 mg/dL   LDL Cholesterol (Calc) 145 (H) mg/dL (calc)   Total CHOL/HDL Ratio 4.4 <5.0 (calc)   Non-HDL Cholesterol (Calc) 177 (H) <130 mg/dL (calc)  Hemoglobin H8I   Collection Time: 03/01/24  9:17 AM  Result Value Ref Range   Hgb A1c MFr Bld 5.5 <5.7 % of total Hgb   Mean Plasma Glucose 111 mg/dL   eAG (mmol/L) 6.2 mmol/L   {Labs (Optional):23779}       Assessment & Plan:   Problem List Items Addressed This Visit   None    Assessment and Plan Assessment & Plan         Follow up plan: No follow-ups on file.

## 2024-04-25 ENCOUNTER — Ambulatory Visit (INDEPENDENT_AMBULATORY_CARE_PROVIDER_SITE_OTHER): Admitting: Nurse Practitioner

## 2024-04-25 ENCOUNTER — Encounter: Payer: Self-pay | Admitting: Nurse Practitioner

## 2024-04-25 ENCOUNTER — Telehealth: Payer: Self-pay

## 2024-04-25 VITALS — BP 118/78 | HR 90 | Temp 97.8°F | Ht 62.0 in | Wt 121.6 lb

## 2024-04-25 DIAGNOSIS — F419 Anxiety disorder, unspecified: Secondary | ICD-10-CM | POA: Diagnosis not present

## 2024-04-25 DIAGNOSIS — R140 Abdominal distension (gaseous): Secondary | ICD-10-CM

## 2024-04-25 DIAGNOSIS — R1084 Generalized abdominal pain: Secondary | ICD-10-CM | POA: Diagnosis not present

## 2024-04-25 DIAGNOSIS — K5790 Diverticulosis of intestine, part unspecified, without perforation or abscess without bleeding: Secondary | ICD-10-CM

## 2024-04-25 NOTE — Telephone Encounter (Signed)
 Yes please place for [place]

## 2024-04-25 NOTE — Telephone Encounter (Signed)
 Copied from CRM [id number]. Topic: Referral - Question >> Apr 25, 2024  1:13 PM Santiya F wrote: Reason for CRM: Patient is calling in because she received a referral for a GI doctor today and the provider is out of network with her insurance. Patient is requesting a [facility].

## 2024-04-26 ENCOUNTER — Ambulatory Visit: Admitting: Nurse Practitioner

## 2024-04-26 ENCOUNTER — Ambulatory Visit: Payer: Self-pay | Admitting: Nurse Practitioner

## 2024-04-26 LAB — COMPREHENSIVE METABOLIC PANEL WITH GFR
AG Ratio: 1.8 (calc) (ref 1.0–2.5)
ALT: 16 U/L (ref 6–29)
AST: 22 U/L (ref 10–35)
Albumin: 5 g/dL (ref 3.6–5.1)
Alkaline phosphatase (APISO): 117 U/L (ref 37–153)
BUN: 11 mg/dL (ref 7–25)
CO2: 27 mmol/L (ref 20–32)
Calcium: 10.4 mg/dL (ref 8.6–10.4)
Chloride: 105 mmol/L (ref 98–110)
Creat: 0.63 mg/dL (ref 0.50–1.05)
Globulin: 2.8 g/dL (ref 1.9–3.7)
Glucose, Bld: 87 mg/dL (ref 65–99)
Potassium: 4.6 mmol/L (ref 3.5–5.3)
Sodium: 142 mmol/L (ref 135–146)
Total Bilirubin: 0.5 mg/dL (ref 0.2–1.2)
Total Protein: 7.8 g/dL (ref 6.1–8.1)
eGFR: 100 mL/min/{1.73_m2} (ref >=60)

## 2024-04-26 LAB — CBC WITH DIFFERENTIAL/PLATELET
Absolute Lymphocytes: 2013 {cells}/uL (ref 850–3900)
Absolute Monocytes: 476 {cells}/uL (ref 200–950)
Basophils Absolute: 61 {cells}/uL (ref 0–200)
Basophils Relative: 1 %
Eosinophils Absolute: 43 {cells}/uL (ref 15–500)
Eosinophils Relative: 0.7 %
HCT: 43.3 % (ref 35.0–45.0)
Hemoglobin: 13.9 g/dL (ref 11.7–15.5)
MCH: 28.5 pg (ref 27.0–33.0)
MCHC: 32.1 g/dL (ref 32.0–36.0)
MCV: 88.9 fL (ref 80.0–100.0)
MPV: 9.9 fL (ref 7.5–12.5)
Monocytes Relative: 7.8 %
Neutro Abs: 3508 {cells}/uL (ref 1500–7800)
Neutrophils Relative %: 57.5 %
Platelets: 298 10*3/uL (ref 140–400)
RBC: 4.87 10*6/uL (ref 3.80–5.10)
RDW: 13.7 % (ref 11.0–15.0)
Total Lymphocyte: 33 %
WBC: 6.1 10*3/uL (ref 3.8–10.8)

## 2024-04-26 LAB — AMYLASE: Amylase: 22 U/L (ref 21–101)

## 2024-04-26 LAB — LIPASE: Lipase: 23 U/L (ref 7–60)

## 2024-04-26 LAB — H. PYLORI BREATH TEST: H. pylori Breath Test: NOT DETECTED

## 2024-07-10 ENCOUNTER — Ambulatory Visit
Admission: RE | Admit: 2024-07-10 | Discharge: 2024-07-10 | Disposition: A | Discharge status: Home / Self Care | Source: Ambulatory Visit | Attending: Nurse Practitioner | Admitting: Nurse Practitioner

## 2024-07-10 DIAGNOSIS — Z1231 Encounter for screening mammogram for malignant neoplasm of breast: Secondary | ICD-10-CM | POA: Insufficient documentation

## 2024-10-17 ENCOUNTER — Other Ambulatory Visit: Payer: Self-pay | Admitting: Nurse Practitioner

## 2024-10-17 DIAGNOSIS — A6004 Herpesviral vulvovaginitis: Secondary | ICD-10-CM

## 2024-10-18 ENCOUNTER — Other Ambulatory Visit: Payer: Self-pay

## 2024-10-18 ENCOUNTER — Other Ambulatory Visit: Payer: Self-pay | Admitting: Nurse Practitioner

## 2024-10-18 DIAGNOSIS — A6004 Herpesviral vulvovaginitis: Secondary | ICD-10-CM

## 2024-10-19 ENCOUNTER — Other Ambulatory Visit: Payer: Self-pay

## 2024-10-19 MED ORDER — VALACYCLOVIR HCL 500 MG PO TABS
500.0000 mg | ORAL_TABLET | Freq: Every day | ORAL | 0 refills | Status: AC
  Filled 2024-10-19: qty 30, 30d supply, fill #0
  Filled 2024-11-13: qty 30, 30d supply, fill #1
  Filled 2025-01-01: qty 30, 30d supply, fill #2

## 2024-10-19 NOTE — Telephone Encounter (Signed)
 Requested Prescriptions  Pending Prescriptions Disp Refills   valACYclovir  (VALTREX ) 500 MG tablet 90 tablet 0    Sig: Take 1 tablet (500 mg total) by mouth daily.     Antimicrobials:  Antiviral Agents - Anti-Herpetic Passed - 10/19/2024  1:51 PM      Passed - Valid encounter within last 12 months    Recent Outpatient Visits           5 months ago Diverticulosis   [facility] Gareth Mliss FALCON, FNP   7 months ago Annual physical exam   [facility] Gareth Mliss FALCON, FNP

## 2025-01-05 ENCOUNTER — Ambulatory Visit: Admitting: Nurse Practitioner

## 2025-03-07 ENCOUNTER — Encounter: Admitting: Nurse Practitioner
